# Patient Record
Sex: Female | Born: 1985 | Race: White | Hispanic: No | Marital: Single | Smoking: Never smoker
Health system: Southern US, Community
[De-identification: ages and names within clinical notes are randomized; demographics above are authoritative.]

## PROBLEM LIST (undated history)

## (undated) DIAGNOSIS — J45909 Unspecified asthma, uncomplicated: Secondary | ICD-10-CM

---
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
## 2006-09-06 ENCOUNTER — Encounter: Admission: RE | Admit: 2006-09-06 | Discharge: 2006-09-06 | Payer: Self-pay | Admitting: Orthopedic Surgery

## 2008-06-16 ENCOUNTER — Emergency Department (HOSPITAL_COMMUNITY): Admission: EM | Admit: 2008-06-16 | Discharge: 2008-06-16 | Payer: Self-pay | Admitting: Family Medicine

## 2008-09-04 ENCOUNTER — Ambulatory Visit: Payer: Self-pay | Admitting: Diagnostic Radiology

## 2008-09-04 ENCOUNTER — Emergency Department (HOSPITAL_BASED_OUTPATIENT_CLINIC_OR_DEPARTMENT_OTHER): Admission: EM | Admit: 2008-09-04 | Discharge: 2008-09-04 | Payer: Self-pay | Admitting: Emergency Medicine

## 2009-06-04 ENCOUNTER — Ambulatory Visit: Payer: Self-pay | Admitting: Family Medicine

## 2009-06-04 DIAGNOSIS — K089 Disorder of teeth and supporting structures, unspecified: Secondary | ICD-10-CM | POA: Insufficient documentation

## 2010-04-05 IMAGING — CR DG CHEST 2V
2 series · 2 of 2 positions shown · non-contrast
Comparison: 06/16/2008.

CLINICAL DATA: Cough and congestion.

CHEST - 2 VIEW

[w chest pa]
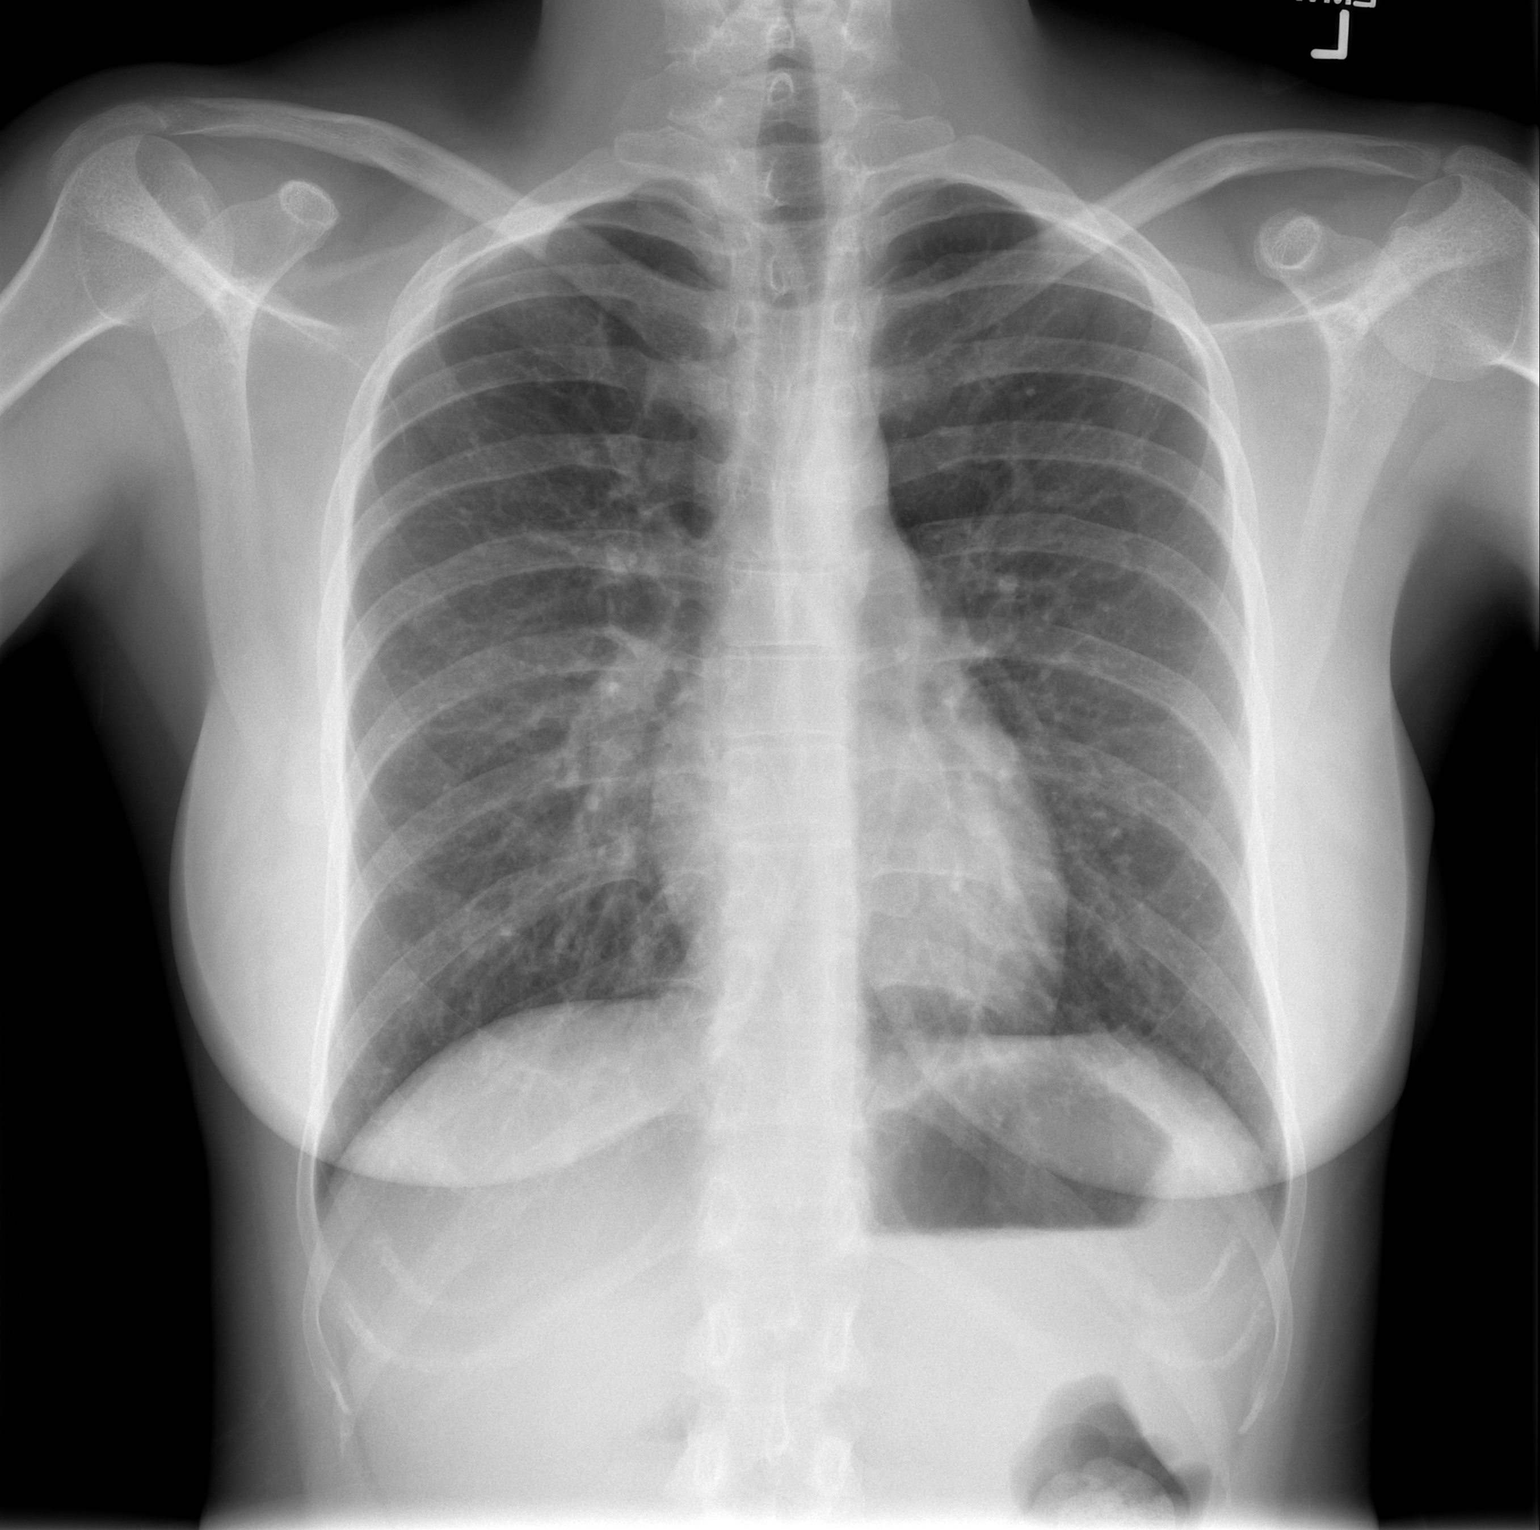

[w chest lat]
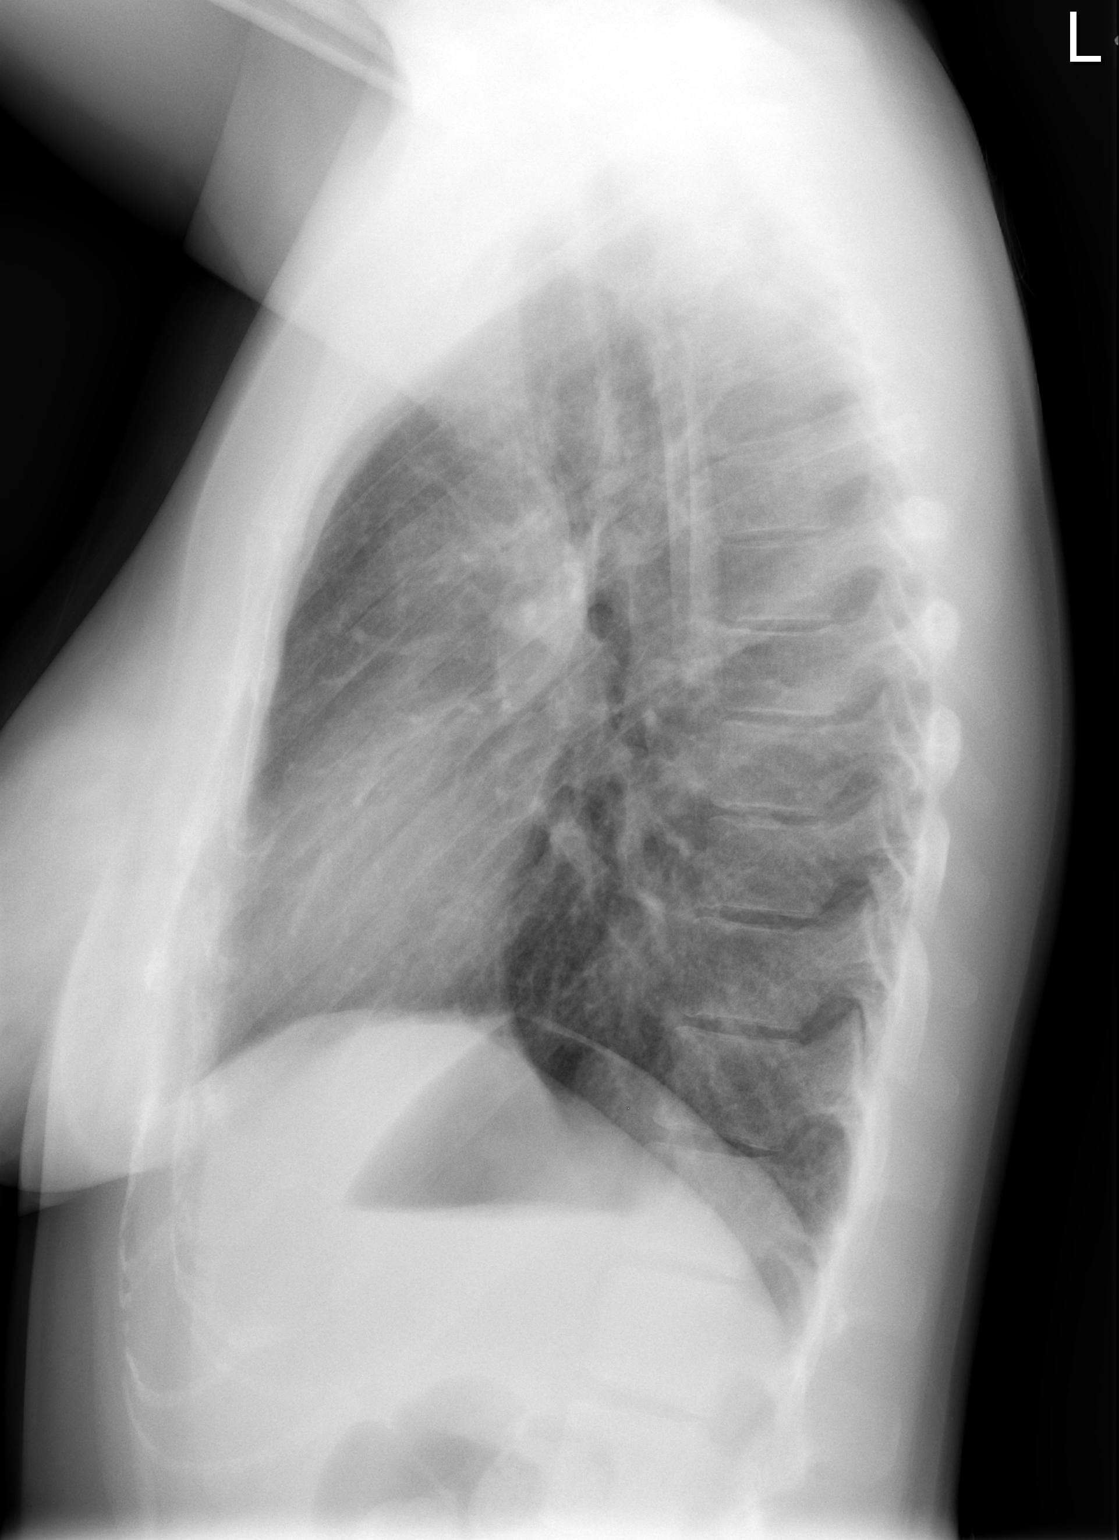

[2 of 2 positions shown; findings below may reference images not displayed]

FINDINGS: Prominent lung markings are present bilaterally and
unchanged from the prior study.  These appear  chronic.  No acute
infiltrate or effusion.  The lung volume is normal.
IMPRESSION: Chronic lung disease.  No acute pneumonia.

## 2010-09-01 ENCOUNTER — Other Ambulatory Visit (HOSPITAL_COMMUNITY)
Admission: RE | Admit: 2010-09-01 | Discharge: 2010-09-01 | Disposition: A | Discharge status: Home / Self Care | Payer: BC Managed Care – PPO | Source: Ambulatory Visit | Attending: Internal Medicine | Admitting: Internal Medicine

## 2010-09-01 ENCOUNTER — Ambulatory Visit (HOSPITAL_COMMUNITY): Payer: Self-pay

## 2010-09-01 DIAGNOSIS — Z01419 Encounter for gynecological examination (general) (routine) without abnormal findings: Secondary | ICD-10-CM | POA: Insufficient documentation

## 2010-09-01 DIAGNOSIS — Z113 Encounter for screening for infections with a predominantly sexual mode of transmission: Secondary | ICD-10-CM | POA: Insufficient documentation

## 2010-10-08 LAB — PREGNANCY, URINE: Preg Test, Ur: NEGATIVE

## 2011-09-07 ENCOUNTER — Other Ambulatory Visit (HOSPITAL_COMMUNITY)
Admission: RE | Admit: 2011-09-07 | Discharge: 2011-09-07 | Disposition: A | Discharge status: Home / Self Care | Payer: BC Managed Care – PPO | Source: Ambulatory Visit | Attending: Internal Medicine | Admitting: Internal Medicine

## 2011-09-07 DIAGNOSIS — Z01419 Encounter for gynecological examination (general) (routine) without abnormal findings: Secondary | ICD-10-CM | POA: Insufficient documentation

## 2012-10-05 ENCOUNTER — Other Ambulatory Visit (HOSPITAL_COMMUNITY)
Admission: RE | Admit: 2012-10-05 | Discharge: 2012-10-05 | Disposition: A | Discharge status: Home / Self Care | Payer: BC Managed Care – PPO | Source: Ambulatory Visit | Attending: Internal Medicine | Admitting: Internal Medicine

## 2012-10-05 DIAGNOSIS — Z01419 Encounter for gynecological examination (general) (routine) without abnormal findings: Secondary | ICD-10-CM | POA: Insufficient documentation

## 2014-06-27 ENCOUNTER — Other Ambulatory Visit (HOSPITAL_COMMUNITY)
Admission: RE | Admit: 2014-06-27 | Discharge: 2014-06-27 | Disposition: A | Discharge status: Home / Self Care | Payer: BC Managed Care – PPO | Source: Ambulatory Visit | Attending: Internal Medicine | Admitting: Internal Medicine

## 2014-06-27 DIAGNOSIS — Z01419 Encounter for gynecological examination (general) (routine) without abnormal findings: Secondary | ICD-10-CM | POA: Diagnosis not present

## 2015-04-03 ENCOUNTER — Ambulatory Visit: Payer: BC Managed Care – PPO

## 2015-04-08 ENCOUNTER — Ambulatory Visit: Payer: BC Managed Care – PPO

## 2015-04-11 ENCOUNTER — Ambulatory Visit: Payer: BC Managed Care – PPO | Attending: Internal Medicine

## 2015-06-17 ENCOUNTER — Emergency Department (INDEPENDENT_AMBULATORY_CARE_PROVIDER_SITE_OTHER)
Admission: EM | Admit: 2015-06-17 | Discharge: 2015-06-17 | Disposition: A | Discharge status: Home / Self Care | Payer: Self-pay | Source: Home / Self Care | Attending: Family Medicine | Admitting: Family Medicine

## 2015-06-17 ENCOUNTER — Emergency Department (INDEPENDENT_AMBULATORY_CARE_PROVIDER_SITE_OTHER): Payer: Self-pay

## 2015-06-17 ENCOUNTER — Encounter (HOSPITAL_COMMUNITY): Payer: Self-pay | Admitting: Emergency Medicine

## 2015-06-17 DIAGNOSIS — J42 Unspecified chronic bronchitis: Secondary | ICD-10-CM

## 2015-06-17 DIAGNOSIS — J4541 Moderate persistent asthma with (acute) exacerbation: Secondary | ICD-10-CM

## 2015-06-17 DIAGNOSIS — J209 Acute bronchitis, unspecified: Secondary | ICD-10-CM

## 2015-06-17 HISTORY — DX: Unspecified asthma, uncomplicated: J45.909

## 2015-06-17 MED ORDER — TRIAMCINOLONE ACETONIDE 40 MG/ML IJ SUSP
40.0000 mg | Freq: Once | INTRAMUSCULAR | Status: AC
  Administered 2015-06-17: 40 mg via INTRAMUSCULAR

## 2015-06-17 MED ORDER — PREDNISONE 20 MG PO TABS: ORAL_TABLET | ORAL | Status: DC

## 2015-06-17 MED ORDER — IPRATROPIUM-ALBUTEROL 0.5-2.5 (3) MG/3ML IN SOLN
3.0000 mL | Freq: Once | RESPIRATORY_TRACT | Status: AC
  Administered 2015-06-17: 3 mL via RESPIRATORY_TRACT

## 2015-06-17 MED ORDER — TRIAMCINOLONE ACETONIDE 40 MG/ML IJ SUSP
INTRAMUSCULAR | Status: AC
  Filled 2015-06-17: qty 1

## 2015-06-17 MED ORDER — IPRATROPIUM-ALBUTEROL 0.5-2.5 (3) MG/3ML IN SOLN
RESPIRATORY_TRACT | Status: AC
  Filled 2015-06-17: qty 3

## 2015-06-17 MED ORDER — ALBUTEROL SULFATE (2.5 MG/3ML) 0.083% IN NEBU
2.5000 mg | INHALATION_SOLUTION | Freq: Once | RESPIRATORY_TRACT | Status: AC
  Administered 2015-06-17: 2.5 mg via RESPIRATORY_TRACT

## 2015-06-17 MED ORDER — ALBUTEROL SULFATE (2.5 MG/3ML) 0.083% IN NEBU
INHALATION_SOLUTION | RESPIRATORY_TRACT | Status: AC
  Filled 2015-06-17: qty 3

## 2015-06-17 MED ORDER — BECLOMETHASONE DIPROPIONATE 80 MCG/ACT IN AERS: 2.0000 | INHALATION_SPRAY | Freq: Two times a day (BID) | RESPIRATORY_TRACT | Status: DC

## 2015-06-17 NOTE — Discharge Instructions (Signed)
Asthma, Acute Bronchospasm Add zantac 150 bid for a week to see if that helps with possible reflux. Acute bronchospasm caused by asthma is also referred to as an asthma attack. Bronchospasm means your air passages become narrowed. The narrowing is caused by inflammation and tightening of the muscles in the air tubes (bronchi) in your lungs. This can make it hard to breathe or cause you to wheeze and cough. CAUSES Possible triggers are:  Animal dander from the skin, hair, or feathers of animals.  Dust mites contained in house dust.  Cockroaches.  Pollen from trees or grass.  Mold.  Cigarette or tobacco smoke.  Air pollutants such as dust, household cleaners, hair sprays, aerosol sprays, paint fumes, strong chemicals, or strong odors.  Cold air or weather changes. Cold air may trigger inflammation. Winds increase molds and pollens in the air.  Strong emotions such as crying or laughing hard.  Stress.  Certain medicines such as aspirin or beta-blockers.  Sulfites in foods and drinks, such as dried fruits and wine.  Infections or inflammatory conditions, such as a flu, cold, or inflammation of the nasal membranes (rhinitis).  Gastroesophageal reflux disease (GERD). GERD is a condition where stomach acid backs up into your esophagus.  Exercise or strenuous activity. SIGNS AND SYMPTOMS   Wheezing.  Excessive coughing, particularly at night.  Chest tightness.  Shortness of breath. DIAGNOSIS  Your health care provider will ask you about your medical history and perform a physical exam. A chest X-ray or blood testing may be performed to look for other causes of your symptoms or other conditions that may have triggered your asthma attack. TREATMENT  Treatment is aimed at reducing inflammation and opening up the airways in your lungs. Most asthma attacks are treated with inhaled medicines. These include quick relief or rescue medicines (such as bronchodilators) and controller  medicines (such as inhaled corticosteroids). These medicines are sometimes given through an inhaler or a nebulizer. Systemic steroid medicine taken by mouth or given through an IV tube also can be used to reduce the inflammation when an attack is moderate or severe. Antibiotic medicines are only used if a bacterial infection is present.  HOME CARE INSTRUCTIONS   Rest.  Drink plenty of liquids. This helps the mucus to remain thin and be easily coughed up. Only use caffeine in moderation and do not use alcohol until you have recovered from your illness.  Do not smoke. Avoid being exposed to secondhand smoke.  You play a critical role in keeping yourself in good health. Avoid exposure to things that cause you to wheeze or to have breathing problems.  Keep your medicines up-to-date and available. Carefully follow your health care provider's treatment plan.  Take your medicine exactly as prescribed.  When pollen or pollution is bad, keep windows closed and use an air conditioner or go to places with air conditioning.  Asthma requires careful medical care. See your health care provider for a follow-up as advised. If you are more than [redacted] weeks pregnant and you were prescribed any new medicines, let your obstetrician know about the visit and how you are doing. Follow up with your health care provider as directed.  After you have recovered from your asthma attack, make an appointment with your outpatient doctor to talk about ways to reduce the likelihood of future attacks. If you do not have a doctor who manages your asthma, make an appointment with a primary care doctor to discuss your asthma. SEEK IMMEDIATE MEDICAL CARE IF:  You are getting worse.  You have trouble breathing. If severe, call your local emergency services (911 in the [place]  You develop chest pain or discomfort.  You are vomiting.  You are not able to keep fluids down.  You are coughing up yellow, green, brown, or bloody  sputum.  You have a fever and your symptoms suddenly get worse.  You have trouble swallowing. MAKE SURE YOU:   Understand these instructions.  Will watch your condition.  Will get help right away if you are not doing well or get worse.   This information is not intended to replace advice given to you by your health care provider. Make sure you discuss any questions you have with your health care provider.   Document Released: 09/29/2006 Document Revised: 06/19/2013 Document Reviewed: 12/20/2012 Elsevier Interactive Patient Education 2016 Else[employer] Inc.  Acute Bronchitis Bronchitis is when the airways that extend from the windpipe into the lungs get red, puffy, and painful (inflamed). Bronchitis often causes thick spit (mucus) to develop. This leads to a cough. A cough is the most common symptom of bronchitis. In acute bronchitis, the condition usually begins suddenly and goes away over time (usually in 2 weeks). Smoking, allergies, and asthma can make bronchitis worse. Repeated episodes of bronchitis may cause more lung problems. HOME CARE  Rest.  Drink enough fluids to keep your pee (urine) clear or pale yellow (unless you need to limit fluids as told by your doctor).  Only take over-the-counter or prescription medicines as told by your doctor.  Avoid smoking and secondhand smoke. These can make bronchitis worse. If you are a smoker, think about using nicotine gum or skin patches. Quitting smoking will help your lungs heal faster.  Reduce the chance of getting bronchitis again by:  Washing your hands often.  Avoiding people with cold symptoms.  Trying not to touch your hands to your mouth, nose, or eyes.  Follow up with your doctor as told. GET HELP IF: Your symptoms do not improve after 1 week of treatment. Symptoms include:  Cough.  Fever.  Coughing up thick spit.  Body aches.  Chest congestion.  Chills.  Shortness of breath.  Sore throat. GET HELP RIGHT  AWAY IF:   You have an increased fever.  You have chills.  You have severe shortness of breath.  You have bloody thick spit (sputum).  You throw up (vomit) often.  You lose too much body fluid (dehydration).  You have a severe headache.  You faint. MAKE SURE YOU:   Understand these instructions.  Will watch your condition.  Will get help right away if you are not doing well or get worse.   This information is not intended to replace advice given to you by your health care provider. Make sure you discuss any questions you have with your health care provider.   Document Released: 12/01/2007 Document Revised: 02/14/2013 Document Reviewed: 12/05/2012 Elsevier Interactive Patient Education 2016 Else[employer] Inc.  Chronic Bronchitis Chronic bronchitis is a lasting inflammation of the bronchial tubes, which are the tubes that carry air into your lungs. This is inflammation that occurs:   On most days of the week.   For at least three months at a time.   Over a period of two years in a row. When the bronchial tubes are inflamed, they start to produce mucus. The inflammation and buildup of mucus make it more difficult to breathe. Chronic bronchitis is usually a permanent problem and is one type of chronic obstructive  pulmonary disease (COPD). People with chronic bronchitis are at greater risk for getting repeated colds, or respiratory infections. CAUSES  Chronic bronchitis most often occurs in people who have:  Long-standing, severe asthma.  A history of smoking.  Asthma and who also smoke. SIGNS AND SYMPTOMS  Chronic bronchitis may cause the following:   A cough that brings up mucus (productive cough).  Shortness of breath.  Early morning headache.  Wheezing.  Chest discomfort.   Recurring respiratory infections. DIAGNOSIS  Your health care provider may confirm the diagnosis by:  Taking your medical history.  Performing a physical exam.  Taking a chest  X-ray.   Performing pulmonary function tests. TREATMENT  Treatment involves controlling symptoms with medicines, oxygen therapy, or making lifestyle changes, such as exercising and eating a healthy, well-balanced diet. Medicines could include:  Inhalers to improve air flow in and out of your lungs.  Antibiotics to treat bacterial infections, such as pneumonia, sinus infections, and acute bronchitis. As a preventative measure, your health care provider may recommend routine vaccinations for influenza and pneumonia. This is to prevent infection and hospitalization since you may be more at risk for these types of infections.  HOME CARE INSTRUCTIONS  Take medicines only as directed by your health care provider.   If you smoke cigarettes, chew tobacco, or use electronic cigarettes, quit. If you need help quitting, ask your health care provider.  Avoid pollen, dust, animal dander, molds, smoke, and other things that cause shortness of breath or wheezing attacks.  Talk to your health care provider about possible exercise routines. Regular exercise is very important to help you feel better.  If you are prescribed oxygen use at home follow these guidelines:  Never smoke while using oxygen. Oxygen does not burn or explode, but flammable materials will burn faster in the presence of oxygen.  Keep a Government social research officerfire extinguisher close by. Let your fire department know that you have oxygen in your home.  Warn visitors not to smoke near you when you are using oxygen. Put up "no smoking" signs in your home where you most often use the oxygen.  Regularly test your smoke detectors at home to make sure they work. If you receive care in your home from a nurse or other health care provider, he or she may also check to make sure your smoke detectors work.  Ask your health care provider whether you would benefit from a pulmonary rehabilitation program.  Do not wait to get medical care if you have any concerning  symptoms. Delays could cause permanent injury and may be life threatening. SEEK MEDICAL CARE IF:  You have increased coughing or shortness of breath or both.  You have muscle aches.  You have chest pain.  Your mucus gets thicker.  Your mucus changes from clear or white to yellow, green, gray, or bloody. SEEK IMMEDIATE MEDICAL CARE IF:  Your usual medicines do not stop your wheezing.   You have increased difficulty breathing.   You have any problems with the medicine you are taking, such as a rash, itching, swelling, or trouble breathing. MAKE SURE YOU:   Understand these instructions.  Will watch your condition.  Will get help right away if you are not doing well or get worse.   This information is not intended to replace advice given to you by your health care provider. Make sure you discuss any questions you have with your health care provider.   Document Released: 04/01/2006 Document Revised: 07/05/2014 Document Reviewed: 07/23/2013 Elsevier  Interactive Patient Education 2016 Elsevier Inc.  Cough, Adult A cough helps to clear your throat and lungs. A cough may last only 2-3 weeks (acute), or it may last longer than 8 weeks (chronic). Many different things can cause a cough. A cough may be a sign of an illness or another medical condition. HOME CARE  Pay attention to any changes in your cough.  Take medicines only as told by your doctor.  If you were prescribed an antibiotic medicine, take it as told by your doctor. Do not stop taking it even if you start to feel better.  Talk with your doctor before you try using a cough medicine.  Drink enough fluid to keep your pee (urine) clear or pale yellow.  If the air is dry, use a cold steam vaporizer or humidifier in your home.  Stay away from things that make you cough at work or at home.  If your cough is worse at night, try using extra pillows to raise your head up higher while you sleep.  Do not smoke, and try  not to be around smoke. If you need help quitting, ask your doctor.  Do not have caffeine.  Do not drink alcohol.  Rest as needed. GET HELP IF:  You have new problems (symptoms).  You cough up yellow fluid (pus).  Your cough does not get better after 2-3 weeks, or your cough gets worse.  Medicine does not help your cough and you are not sleeping well.  You have pain that gets worse or pain that is not helped with medicine.  You have a fever.  You are losing weight and you do not know why.  You have night sweats. GET HELP RIGHT AWAY IF:  You cough up blood.  You have trouble breathing.  Your heartbeat is very fast.   This information is not intended to replace advice given to you by your health care provider. Make sure you discuss any questions you have with your health care provider.   Document Released: 02/25/2011 Document Revised: 03/05/2015 Document Reviewed: 08/21/2014 Elsevier Interactive Patient Education [employer]

## 2015-06-17 NOTE — ED Notes (Signed)
Frequent coughing

## 2015-06-17 NOTE — ED Provider Notes (Signed)
CSN: [id number]     Arrival date & time 06/17/15  1519 History   First MD Initiated Contact with Patient 06/17/15 1620     Chief Complaint  Patient presents with  . Cough   (Consider location/radiation/quality/duration/timing/severity/associated sxs/prior Treatment) HPI Comments: 29 year old female with a history of asthma and allergies has had a cough for 6 weeks. She has spasms of cough and periods of prolonged cough. She uses albuterol but this has minimal effect. At some point during this time her mother found some antibiotic  gave her a seven-day course. She does not know the name. This did no good. She takes Allegra and Zyrtec separated by 12 hours. She notes some PND.  Patient is a 29 y.o. female presenting with cough.  Cough Associated symptoms: shortness of breath and wheezing   Associated symptoms: no ear pain, no fever and no sore throat     Past Medical History  Diagnosis Date  . Asthma    History reviewed. No pertinent past surgical history. No family history on file. Social History  Substance Use Topics  . Smoking status: Never Smoker   . Smokeless tobacco: None  . Alcohol Use: No   OB History    No data available     Review of Systems  Constitutional: Negative for fever, activity change and fatigue.  HENT: Positive for postnasal drip. Negative for ear pain and sore throat.   Eyes: Negative.   Respiratory: Positive for cough, shortness of breath and wheezing.   Cardiovascular: Negative.   Gastrointestinal: Negative.   Neurological: Negative.   All other systems reviewed and are negative.   Allergies  Review of patient's allergies indicates no known allergies.  Home Medications   Prior to Admission medications   Medication Sig Start Date End Date Taking? Authorizing Provider  Dextromethorphan Polistirex (DELSYM PO) Take by mouth.   Yes Historical Provider, MD  OVER THE COUNTER MEDICATION Inhaler antibiotic   Yes Historical Provider, MD   Pseudoephedrine-Guaifenesin (MUCINEX D PO) Take by mouth.   Yes Historical Provider, MD  beclomethasone (QVAR) 80 MCG/ACT inhaler Inhale 2 puffs into the lungs 2 (two) times daily. 06/17/15   Hayden Rasmussenavid Ajee Heasley, NP  predniSONE (DELTASONE) 20 MG tablet 3 Tabs PO Days 1-3, then 2 tabs PO Days 4-6, then 1 tab PO Day 7-9, then Half Tab PO Day 10-12 06/17/15   Hayden Rasmussenavid Canaan Holzer, NP   Meds Ordered and Administered this Visit   Medications  ipratropium-albuterol (DUONEB) 0.5-2.5 (3) MG/3ML nebulizer solution 3 mL (3 mLs Nebulization Given 06/17/15 1715)  triamcinolone acetonide (KENALOG-40) injection 40 mg (40 mg Intramuscular Given 06/17/15 1715)  albuterol (PROVENTIL) (2.5 MG/3ML) 0.083% nebulizer solution 2.5 mg (2.5 mg Nebulization Given 06/17/15 1759)    BP 111/81 mmHg  Pulse 86  Temp(Src) 98.5 F (36.9 C) (Oral)  Resp 20  SpO2 97%  LMP 06/07/2015 No data found.   Physical Exam  Constitutional: She appears well-developed and well-nourished. No distress.  HENT:  Right Ear: External ear normal.  Left Ear: External ear normal.  Oropharynx with minor erythema and clear PND. No exudates. Bilateral TMs are normal  Eyes: Conjunctivae and EOM are normal.  Neck: Normal range of motion. Neck supple.  Cardiovascular: Normal rate, regular rhythm and normal heart sounds.   Pulmonary/Chest: Effort normal. She has wheezes.  Prolonged expiratory phase. Diminished air sounds. Good chest expansion.  Musculoskeletal: She exhibits no edema.  Lymphadenopathy:    She has no cervical adenopathy.  Neurological: She is alert. She exhibits normal  muscle tone.  Skin: Skin is warm and dry. She is not diaphoretic.  Nursing note and vitals reviewed.   ED Course  Procedures (including critical care time)  Labs Review Labs Reviewed - No data to display  Imaging Review Dg Chest 2 View  06/17/2015  CLINICAL DATA:  Six week history of cough. History of chronic asthma and bronchitis. EXAM: CHEST  2 VIEW COMPARISON:   09/04/2008. FINDINGS: FNA cardiac silhouette, mediastinal and hilar contours are within normal limits. There are chronic bronchitic type lung changes and possible superimposed acute bronchitis but no infiltrates, edema or effusions. No worrisome pulmonary nodules. The bony thorax is intact. IMPRESSION: Bronchitic type changes in lungs which appear relatively chronic but could not exclude superimposed acute bronchitis. No infiltrates or effusions. Electronically Signed   By: Rudie Meyer M.D.   On: 06/17/2015 18:02     Visual Acuity Review  Right Eye Distance:   Left Eye Distance:   Bilateral Distance:    Right Eye Near:   Left Eye Near:    Bilateral Near:         MDM   1. Asthma exacerbation attacks, moderate persistent   2. Chronic bronchitis with acute exacerbation (HCC)    After duoneb  and albuterol 2.5 mg mod to good increase in air movement and decrease in wheeze. Cont with some coughing but less frequent. Prednisone taper dose qvar as dir If too expensive, rx for asmanex. Add zantac 150 bid for a week to see if that helps with possible reflux. Cont the albuterol HFA q 4h prn.     Hayden Rasmussen, NP 06/17/15 [phone]

## 2016-05-11 ENCOUNTER — Ambulatory Visit (INDEPENDENT_AMBULATORY_CARE_PROVIDER_SITE_OTHER): Payer: Self-pay | Admitting: Family Medicine

## 2016-05-11 VITALS — BP 102/74 | HR 95 | Temp 98.9°F | Ht 65.25 in | Wt 132.0 lb

## 2016-05-11 DIAGNOSIS — J45909 Unspecified asthma, uncomplicated: Secondary | ICD-10-CM | POA: Insufficient documentation

## 2016-05-11 DIAGNOSIS — J4520 Mild intermittent asthma, uncomplicated: Secondary | ICD-10-CM

## 2016-05-11 MED ORDER — AMOXICILLIN 500 MG PO TABS: 500.0000 mg | ORAL_TABLET | Freq: Two times a day (BID) | ORAL | 0 refills | Status: AC

## 2016-05-11 MED ORDER — BENZONATATE 100 MG PO CAPS: 200.0000 mg | ORAL_CAPSULE | Freq: Three times a day (TID) | ORAL | 0 refills | Status: AC | PRN

## 2016-05-11 MED ORDER — PREDNISONE 20 MG PO TABS: 20.0000 mg | ORAL_TABLET | Freq: Every day | ORAL | 0 refills | Status: DC

## 2016-05-11 MED ORDER — METHYLPREDNISOLONE ACETATE 80 MG/ML IJ SUSP
80.0000 mg | INTRAMUSCULAR | Status: AC
  Administered 2016-05-11: 80 mg via INTRAMUSCULAR

## 2016-05-11 NOTE — Progress Notes (Signed)
   Subjective:    Patient ID: Cindy AlpersSusan R Dunn, female    DOB: 02-Mar-1986, 30 y.o.   MRN: [id number]  HPI Asthma Patient is here for evaluation of asthma. Patient's symptoms include pt denies wheezing has not used Albuterol inhaler today/out of previous prescription Q-Var. Associated symptoms include nonproductive cough. The patient has been suffering from these symptoms for approximately 2 weeks worse at night.  Diagnosed with Asthma/hx of receiving allergy injections. Symptoms have been gradually worsening since their onset. Medications used in the past to treat these symptoms include inhaled steroids and over the counter medications. Suspected precipitants include no identifiable factor. Patient is awoken from sleep approximately > 5 times per night. Patient has not required emergency room treatment for these symptoms, and has not required hospitalization. The patient has not been intubated in the past. Denies SOB    Review of Systems  Constitutional: Negative.   HENT: Positive for voice change.   Eyes: Negative.   Respiratory: Positive for cough.   Cardiovascular: Negative.   Allergic/Immunologic: Positive for environmental allergies.  Neurological: Negative.   Psychiatric/Behavioral: Negative.        Objective:   Physical Exam  Constitutional: She is oriented to person, place, and time. She appears well-developed and well-nourished.  HENT:  Head: Normocephalic and atraumatic.  Right Ear: External ear normal.  Left Ear: External ear normal.  Nose: Nose normal.  Throat mild erythema  Eyes: Conjunctivae and EOM are normal. Pupils are equal, round, and reactive to light.  Cardiovascular: Normal rate, regular rhythm and normal heart sounds.   Pulmonary/Chest: Effort normal and breath sounds normal.  Expiratory wheeze noted LUL/non productive cough/no acute distress/  Neurological: She is alert and oriented to person, place, and time.  Skin: Skin is warm and dry.  Psychiatric: She  has a normal mood and affect. Her behavior is normal. Judgment and thought content normal.          Assessment & Plan:  See pt instructions/Hold antibiotic prescription x 3 days/Recommended Qvar for asthma control/pt without insurance.

## 2016-05-11 NOTE — Patient Instructions (Addendum)

## 2017-01-15 IMAGING — DX DG CHEST 2V
2 series · 2 of 2 positions shown · non-contrast
Comparison: 09/04/2008.

CLINICAL DATA: Six week history of cough. History of chronic asthma
and bronchitis.

EXAM:
CHEST  2 VIEW

[chest pa]
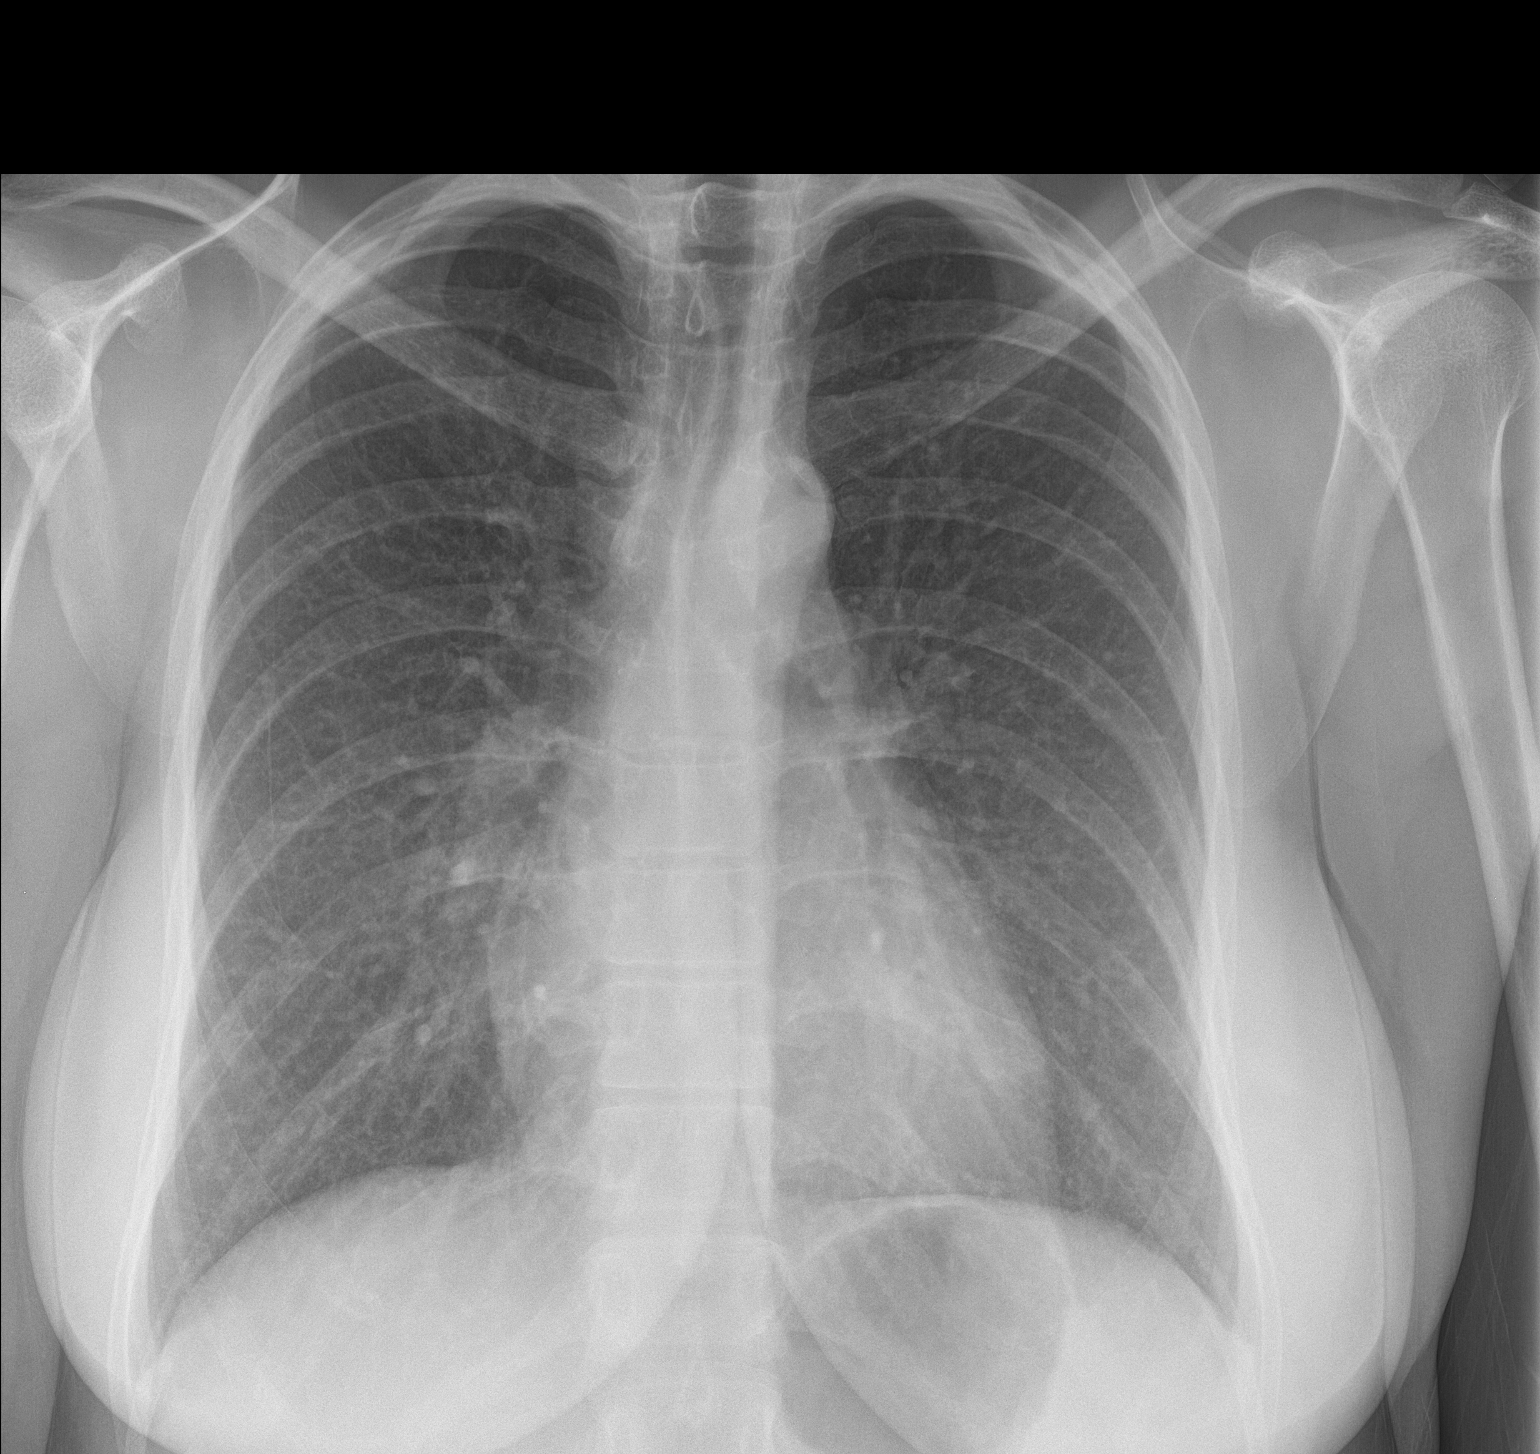

[chest lat]
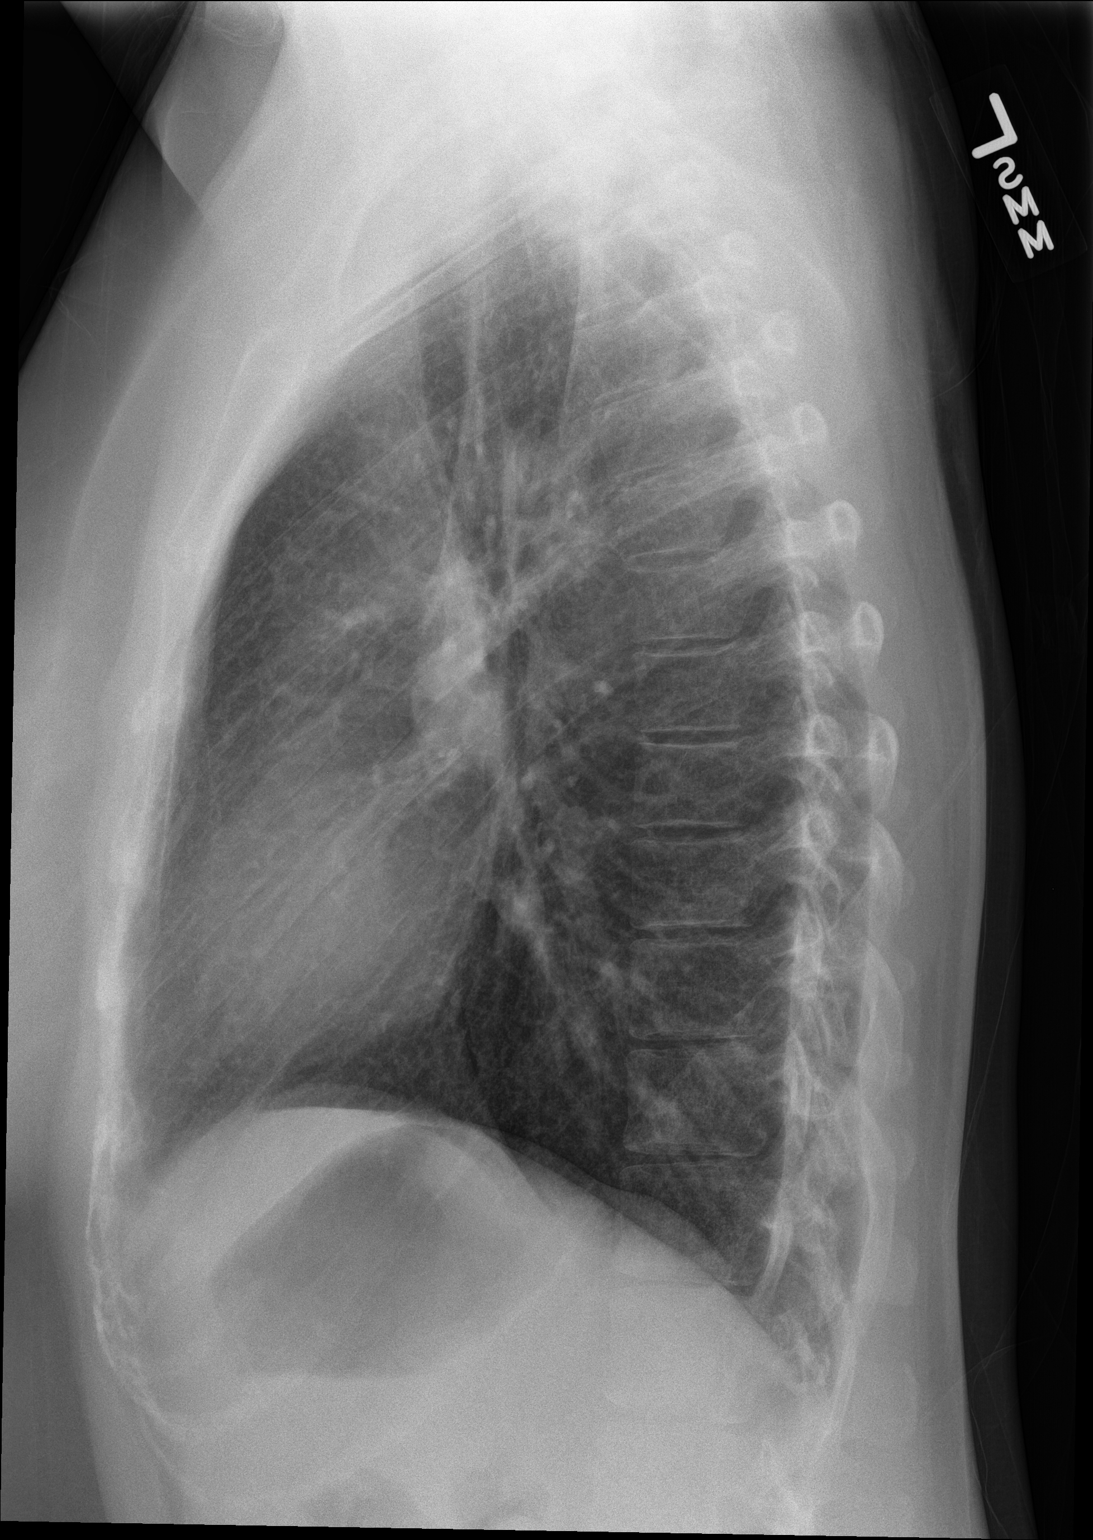

[2 of 2 positions shown; findings below may reference images not displayed]

FINDINGS: FNA cardiac silhouette, mediastinal and hilar contours are within
normal limits. There are chronic bronchitic type lung changes and
possible superimposed acute bronchitis but no infiltrates, edema or
effusions. No worrisome pulmonary nodules. The bony thorax is
intact.
IMPRESSION: Bronchitic type changes in lungs which appear relatively chronic but
could not exclude superimposed acute bronchitis. No infiltrates or
effusions.

## 2017-04-15 MED FILL — EPINEPHRINE 0.3 MG AUTO-INJ: 0.3 | 30 days supply | Qty: 2 | Fill #0

## 2017-10-03 DIAGNOSIS — I7300 Raynaud's syndrome without gangrene: Secondary | ICD-10-CM | POA: Insufficient documentation

## 2017-10-03 MED FILL — MONTELUKAST SOD 10 MG TAB: 10 | 30 days supply | Qty: 30 | Fill #0

## 2017-10-03 MED FILL — AZITHROMYCIN 250 MG TABLET: 250 | 5 days supply | Qty: 6 | Fill #0

## 2017-10-03 MED FILL — METHYLPREDNISOLONE 4 MG TAB: 4 | 6 days supply | Qty: 21 | Fill #0

## 2017-11-03 MED FILL — MONTELUKAST SOD 10 MG TAB: 10 | 30 days supply | Qty: 30 | Fill #1

## 2017-12-09 MED FILL — MONTELUKAST SOD 10 MG TAB: 10 | 30 days supply | Qty: 30 | Fill #2

## 2018-01-19 MED FILL — MONTELUKAST SOD 10 MG TAB: 10 | 30 days supply | Qty: 30 | Fill #0

## 2018-05-17 MED FILL — MONTELUKAST SOD 10 MG TAB: 10 | 30 days supply | Qty: 30 | Fill #1

## 2018-06-30 MED FILL — MONTELUKAST SOD 10 MG TAB: 10 | 30 days supply | Qty: 30 | Fill #2

## 2018-09-11 DIAGNOSIS — J3089 Other allergic rhinitis: Secondary | ICD-10-CM | POA: Insufficient documentation

## 2018-09-11 DIAGNOSIS — J302 Other seasonal allergic rhinitis: Secondary | ICD-10-CM | POA: Insufficient documentation

## 2018-09-22 MED FILL — MONTELUKAST SOD 10 MG TAB: 10 | 90 days supply | Qty: 90 | Fill #0

## 2019-01-05 MED FILL — MONTELUKAST SOD 10 MG TAB: 10 | 90 days supply | Qty: 90 | Fill #0

## 2019-04-25 MED FILL — MONTELUKAST SOD 10 MG TAB: 10 | 90 days supply | Qty: 90 | Fill #0

## 2020-03-24 ENCOUNTER — Other Ambulatory Visit (HOSPITAL_COMMUNITY): Payer: Self-pay | Admitting: Nurse Practitioner

## 2020-03-24 MED FILL — MONTELUKAST SOD 10 MG TAB: 10 | 90 days supply | Qty: 90 | Fill #0

## 2020-05-02 ENCOUNTER — Ambulatory Visit: Payer: Self-pay | Attending: Internal Medicine

## 2020-05-02 ENCOUNTER — Ambulatory Visit: Payer: Self-pay

## 2020-05-02 NOTE — Progress Notes (Signed)
   Covid-19 Vaccination Clinic  Name:  Cindy Dunn    MRN: [id number] DOB: [birth year]  05/02/2020  Cindy Dunn was observed post Covid-19 immunization for 15 minutes without incident. She was provided with Vaccine Information Sheet and instruction to access the V-Safe system.   Cindy Dunn was instructed to call 911 with any severe reactions post vaccine: Marland Kitchen Difficulty breathing  . Swelling of face and throat  . A fast heartbeat  . A bad rash all over body  . Dizziness and weakness

## 2020-05-05 ENCOUNTER — Ambulatory Visit: Payer: Self-pay

## 2020-06-10 ENCOUNTER — Other Ambulatory Visit (HOSPITAL_COMMUNITY): Payer: Self-pay | Admitting: Allergy and Immunology

## 2020-06-10 DIAGNOSIS — J3089 Other allergic rhinitis: Secondary | ICD-10-CM | POA: Diagnosis not present

## 2020-06-10 DIAGNOSIS — J301 Allergic rhinitis due to pollen: Secondary | ICD-10-CM | POA: Diagnosis not present

## 2020-06-10 DIAGNOSIS — J4530 Mild persistent asthma, uncomplicated: Secondary | ICD-10-CM | POA: Diagnosis not present

## 2020-06-11 ENCOUNTER — Other Ambulatory Visit (HOSPITAL_COMMUNITY): Payer: Self-pay | Admitting: Allergy and Immunology

## 2020-06-25 DIAGNOSIS — J301 Allergic rhinitis due to pollen: Secondary | ICD-10-CM | POA: Diagnosis not present

## 2020-06-25 DIAGNOSIS — Z20822 Contact with and (suspected) exposure to covid-19: Secondary | ICD-10-CM | POA: Diagnosis not present

## 2020-06-25 DIAGNOSIS — R059 Cough, unspecified: Secondary | ICD-10-CM | POA: Diagnosis not present

## 2020-06-25 DIAGNOSIS — J4521 Mild intermittent asthma with (acute) exacerbation: Secondary | ICD-10-CM | POA: Diagnosis not present

## 2020-06-25 MED FILL — ALBUTEROL SULFATE HFA 108 (: 108 (90 BAS | 17 days supply | Qty: 18 | Fill #0

## 2020-06-25 MED FILL — MONTELUKAST SOD 10 MG TAB: 10 | 90 days supply | Qty: 90 | Fill #0

## 2020-06-25 MED FILL — DESLORATADINE 5 MG TAB: 5 | 30 days supply | Qty: 30 | Fill #0

## 2020-06-25 MED FILL — EPINEPHRINE 0.3 MG AUTO-INJ: 0.3 | 2 days supply | Qty: 2 | Fill #0

## 2020-06-26 DIAGNOSIS — J3089 Other allergic rhinitis: Secondary | ICD-10-CM | POA: Diagnosis not present

## 2020-07-03 DIAGNOSIS — J3089 Other allergic rhinitis: Secondary | ICD-10-CM | POA: Diagnosis not present

## 2020-07-03 DIAGNOSIS — J301 Allergic rhinitis due to pollen: Secondary | ICD-10-CM | POA: Diagnosis not present

## 2020-07-07 DIAGNOSIS — J301 Allergic rhinitis due to pollen: Secondary | ICD-10-CM | POA: Diagnosis not present

## 2020-07-07 DIAGNOSIS — J3089 Other allergic rhinitis: Secondary | ICD-10-CM | POA: Diagnosis not present

## 2020-07-10 DIAGNOSIS — J301 Allergic rhinitis due to pollen: Secondary | ICD-10-CM | POA: Diagnosis not present

## 2020-07-10 DIAGNOSIS — J3089 Other allergic rhinitis: Secondary | ICD-10-CM | POA: Diagnosis not present

## 2020-07-15 DIAGNOSIS — J3089 Other allergic rhinitis: Secondary | ICD-10-CM | POA: Diagnosis not present

## 2020-07-15 DIAGNOSIS — J301 Allergic rhinitis due to pollen: Secondary | ICD-10-CM | POA: Diagnosis not present

## 2020-07-18 DIAGNOSIS — J3089 Other allergic rhinitis: Secondary | ICD-10-CM | POA: Diagnosis not present

## 2020-07-18 DIAGNOSIS — J301 Allergic rhinitis due to pollen: Secondary | ICD-10-CM | POA: Diagnosis not present

## 2020-07-22 DIAGNOSIS — J301 Allergic rhinitis due to pollen: Secondary | ICD-10-CM | POA: Diagnosis not present

## 2020-07-22 DIAGNOSIS — J3089 Other allergic rhinitis: Secondary | ICD-10-CM | POA: Diagnosis not present

## 2020-07-22 MED FILL — DESLORATADINE 5 MG TAB: 5 | 90 days supply | Qty: 90 | Fill #1

## 2020-07-24 DIAGNOSIS — J301 Allergic rhinitis due to pollen: Secondary | ICD-10-CM | POA: Diagnosis not present

## 2020-07-24 DIAGNOSIS — J3089 Other allergic rhinitis: Secondary | ICD-10-CM | POA: Diagnosis not present

## 2020-07-28 DIAGNOSIS — J301 Allergic rhinitis due to pollen: Secondary | ICD-10-CM | POA: Diagnosis not present

## 2020-07-28 DIAGNOSIS — J3089 Other allergic rhinitis: Secondary | ICD-10-CM | POA: Diagnosis not present

## 2020-08-01 DIAGNOSIS — J301 Allergic rhinitis due to pollen: Secondary | ICD-10-CM | POA: Diagnosis not present

## 2020-08-01 DIAGNOSIS — J3089 Other allergic rhinitis: Secondary | ICD-10-CM | POA: Diagnosis not present

## 2020-08-06 DIAGNOSIS — J3089 Other allergic rhinitis: Secondary | ICD-10-CM | POA: Diagnosis not present

## 2020-08-06 DIAGNOSIS — J301 Allergic rhinitis due to pollen: Secondary | ICD-10-CM | POA: Diagnosis not present

## 2020-08-12 DIAGNOSIS — J3089 Other allergic rhinitis: Secondary | ICD-10-CM | POA: Diagnosis not present

## 2020-08-12 DIAGNOSIS — J301 Allergic rhinitis due to pollen: Secondary | ICD-10-CM | POA: Diagnosis not present

## 2020-08-14 DIAGNOSIS — J301 Allergic rhinitis due to pollen: Secondary | ICD-10-CM | POA: Diagnosis not present

## 2020-08-14 DIAGNOSIS — J3089 Other allergic rhinitis: Secondary | ICD-10-CM | POA: Diagnosis not present

## 2020-08-22 DIAGNOSIS — J301 Allergic rhinitis due to pollen: Secondary | ICD-10-CM | POA: Diagnosis not present

## 2020-08-22 DIAGNOSIS — J3089 Other allergic rhinitis: Secondary | ICD-10-CM | POA: Diagnosis not present

## 2020-08-26 DIAGNOSIS — J301 Allergic rhinitis due to pollen: Secondary | ICD-10-CM | POA: Diagnosis not present

## 2020-08-26 DIAGNOSIS — J3089 Other allergic rhinitis: Secondary | ICD-10-CM | POA: Diagnosis not present

## 2020-08-29 DIAGNOSIS — J3089 Other allergic rhinitis: Secondary | ICD-10-CM | POA: Diagnosis not present

## 2020-08-29 DIAGNOSIS — J301 Allergic rhinitis due to pollen: Secondary | ICD-10-CM | POA: Diagnosis not present

## 2020-09-03 DIAGNOSIS — J3089 Other allergic rhinitis: Secondary | ICD-10-CM | POA: Diagnosis not present

## 2020-09-03 DIAGNOSIS — J301 Allergic rhinitis due to pollen: Secondary | ICD-10-CM | POA: Diagnosis not present

## 2020-09-05 DIAGNOSIS — J301 Allergic rhinitis due to pollen: Secondary | ICD-10-CM | POA: Diagnosis not present

## 2020-09-05 DIAGNOSIS — J3089 Other allergic rhinitis: Secondary | ICD-10-CM | POA: Diagnosis not present

## 2020-09-11 DIAGNOSIS — J3089 Other allergic rhinitis: Secondary | ICD-10-CM | POA: Diagnosis not present

## 2020-09-11 DIAGNOSIS — J301 Allergic rhinitis due to pollen: Secondary | ICD-10-CM | POA: Diagnosis not present

## 2020-09-16 DIAGNOSIS — J3089 Other allergic rhinitis: Secondary | ICD-10-CM | POA: Diagnosis not present

## 2020-09-16 DIAGNOSIS — J301 Allergic rhinitis due to pollen: Secondary | ICD-10-CM | POA: Diagnosis not present

## 2020-09-24 DIAGNOSIS — J3089 Other allergic rhinitis: Secondary | ICD-10-CM | POA: Diagnosis not present

## 2020-09-24 DIAGNOSIS — J301 Allergic rhinitis due to pollen: Secondary | ICD-10-CM | POA: Diagnosis not present

## 2020-09-29 ENCOUNTER — Other Ambulatory Visit (HOSPITAL_COMMUNITY): Payer: Self-pay

## 2020-09-29 DIAGNOSIS — J301 Allergic rhinitis due to pollen: Secondary | ICD-10-CM | POA: Diagnosis not present

## 2020-09-29 DIAGNOSIS — J3089 Other allergic rhinitis: Secondary | ICD-10-CM | POA: Diagnosis not present

## 2020-09-29 MED FILL — Montelukast Sodium Tab 10 MG (Base Equiv): ORAL | 90 days supply | Qty: 90 | Fill #0 | Status: AC

## 2020-10-06 DIAGNOSIS — J301 Allergic rhinitis due to pollen: Secondary | ICD-10-CM | POA: Diagnosis not present

## 2020-10-06 DIAGNOSIS — J3089 Other allergic rhinitis: Secondary | ICD-10-CM | POA: Diagnosis not present

## 2020-10-15 DIAGNOSIS — J301 Allergic rhinitis due to pollen: Secondary | ICD-10-CM | POA: Diagnosis not present

## 2020-10-15 DIAGNOSIS — J3089 Other allergic rhinitis: Secondary | ICD-10-CM | POA: Diagnosis not present

## 2020-10-21 DIAGNOSIS — J3089 Other allergic rhinitis: Secondary | ICD-10-CM | POA: Diagnosis not present

## 2020-10-21 DIAGNOSIS — J301 Allergic rhinitis due to pollen: Secondary | ICD-10-CM | POA: Diagnosis not present

## 2020-10-29 DIAGNOSIS — J3089 Other allergic rhinitis: Secondary | ICD-10-CM | POA: Diagnosis not present

## 2020-10-29 DIAGNOSIS — J301 Allergic rhinitis due to pollen: Secondary | ICD-10-CM | POA: Diagnosis not present

## 2020-11-06 DIAGNOSIS — J3089 Other allergic rhinitis: Secondary | ICD-10-CM | POA: Diagnosis not present

## 2020-11-06 DIAGNOSIS — J301 Allergic rhinitis due to pollen: Secondary | ICD-10-CM | POA: Diagnosis not present

## 2020-11-11 DIAGNOSIS — J301 Allergic rhinitis due to pollen: Secondary | ICD-10-CM | POA: Diagnosis not present

## 2020-11-11 DIAGNOSIS — J3089 Other allergic rhinitis: Secondary | ICD-10-CM | POA: Diagnosis not present

## 2020-11-12 ENCOUNTER — Other Ambulatory Visit (HOSPITAL_COMMUNITY): Payer: Self-pay

## 2020-11-12 MED FILL — Desloratadine Tab 5 MG: ORAL | 30 days supply | Qty: 30 | Fill #0 | Status: AC

## 2020-11-19 DIAGNOSIS — J301 Allergic rhinitis due to pollen: Secondary | ICD-10-CM | POA: Diagnosis not present

## 2020-11-19 DIAGNOSIS — J3089 Other allergic rhinitis: Secondary | ICD-10-CM | POA: Diagnosis not present

## 2020-11-28 DIAGNOSIS — J301 Allergic rhinitis due to pollen: Secondary | ICD-10-CM | POA: Diagnosis not present

## 2020-11-28 DIAGNOSIS — R21 Rash and other nonspecific skin eruption: Secondary | ICD-10-CM | POA: Diagnosis not present

## 2020-11-28 DIAGNOSIS — J3089 Other allergic rhinitis: Secondary | ICD-10-CM | POA: Diagnosis not present

## 2020-12-02 DIAGNOSIS — J301 Allergic rhinitis due to pollen: Secondary | ICD-10-CM | POA: Diagnosis not present

## 2020-12-02 DIAGNOSIS — J3089 Other allergic rhinitis: Secondary | ICD-10-CM | POA: Diagnosis not present

## 2020-12-10 DIAGNOSIS — J3089 Other allergic rhinitis: Secondary | ICD-10-CM | POA: Diagnosis not present

## 2020-12-10 DIAGNOSIS — J301 Allergic rhinitis due to pollen: Secondary | ICD-10-CM | POA: Diagnosis not present

## 2020-12-15 ENCOUNTER — Other Ambulatory Visit (HOSPITAL_COMMUNITY): Payer: Self-pay

## 2020-12-15 MED FILL — Desloratadine Tab 5 MG: ORAL | 30 days supply | Qty: 30 | Fill #1 | Status: AC

## 2020-12-17 DIAGNOSIS — J301 Allergic rhinitis due to pollen: Secondary | ICD-10-CM | POA: Diagnosis not present

## 2020-12-17 DIAGNOSIS — J3089 Other allergic rhinitis: Secondary | ICD-10-CM | POA: Diagnosis not present

## 2020-12-26 DIAGNOSIS — J301 Allergic rhinitis due to pollen: Secondary | ICD-10-CM | POA: Diagnosis not present

## 2020-12-26 DIAGNOSIS — J3089 Other allergic rhinitis: Secondary | ICD-10-CM | POA: Diagnosis not present

## 2020-12-30 DIAGNOSIS — J301 Allergic rhinitis due to pollen: Secondary | ICD-10-CM | POA: Diagnosis not present

## 2020-12-30 DIAGNOSIS — J3089 Other allergic rhinitis: Secondary | ICD-10-CM | POA: Diagnosis not present

## 2021-01-07 DIAGNOSIS — J3089 Other allergic rhinitis: Secondary | ICD-10-CM | POA: Diagnosis not present

## 2021-01-07 DIAGNOSIS — J301 Allergic rhinitis due to pollen: Secondary | ICD-10-CM | POA: Diagnosis not present

## 2021-01-14 ENCOUNTER — Other Ambulatory Visit (HOSPITAL_COMMUNITY): Payer: Self-pay

## 2021-01-14 DIAGNOSIS — J3089 Other allergic rhinitis: Secondary | ICD-10-CM | POA: Diagnosis not present

## 2021-01-14 DIAGNOSIS — J301 Allergic rhinitis due to pollen: Secondary | ICD-10-CM | POA: Diagnosis not present

## 2021-01-19 ENCOUNTER — Other Ambulatory Visit (HOSPITAL_COMMUNITY): Payer: Self-pay

## 2021-01-19 ENCOUNTER — Other Ambulatory Visit: Payer: Self-pay

## 2021-01-19 MED ORDER — DESLORATADINE 5 MG PO TABS
5.0000 mg | ORAL_TABLET | Freq: Every day | ORAL | 4 refills | Status: DC
  Filled 2021-01-19: qty 30, 30d supply, fill #0

## 2021-01-19 MED ORDER — MONTELUKAST SODIUM 10 MG PO TABS: 10.0000 mg | ORAL_TABLET | Freq: Every day | ORAL | 1 refills | Status: DC

## 2021-01-19 MED ORDER — MONTELUKAST SODIUM 10 MG PO TABS
10.0000 mg | ORAL_TABLET | Freq: Every day | ORAL | 1 refills | Status: DC
  Filled 2021-01-19: qty 90, 90d supply, fill #0
  Filled 2021-04-24: qty 90, 90d supply, fill #1

## 2021-01-21 DIAGNOSIS — J301 Allergic rhinitis due to pollen: Secondary | ICD-10-CM | POA: Diagnosis not present

## 2021-01-21 DIAGNOSIS — J3089 Other allergic rhinitis: Secondary | ICD-10-CM | POA: Diagnosis not present

## 2021-01-21 DIAGNOSIS — J3081 Allergic rhinitis due to animal (cat) (dog) hair and dander: Secondary | ICD-10-CM | POA: Diagnosis not present

## 2021-01-26 DIAGNOSIS — J3089 Other allergic rhinitis: Secondary | ICD-10-CM | POA: Diagnosis not present

## 2021-01-26 DIAGNOSIS — J301 Allergic rhinitis due to pollen: Secondary | ICD-10-CM | POA: Diagnosis not present

## 2021-02-02 DIAGNOSIS — J301 Allergic rhinitis due to pollen: Secondary | ICD-10-CM | POA: Diagnosis not present

## 2021-02-03 DIAGNOSIS — J3089 Other allergic rhinitis: Secondary | ICD-10-CM | POA: Diagnosis not present

## 2021-02-04 DIAGNOSIS — J3081 Allergic rhinitis due to animal (cat) (dog) hair and dander: Secondary | ICD-10-CM | POA: Diagnosis not present

## 2021-02-04 DIAGNOSIS — J3089 Other allergic rhinitis: Secondary | ICD-10-CM | POA: Diagnosis not present

## 2021-02-04 DIAGNOSIS — J301 Allergic rhinitis due to pollen: Secondary | ICD-10-CM | POA: Diagnosis not present

## 2021-02-09 DIAGNOSIS — J3089 Other allergic rhinitis: Secondary | ICD-10-CM | POA: Diagnosis not present

## 2021-02-09 DIAGNOSIS — J301 Allergic rhinitis due to pollen: Secondary | ICD-10-CM | POA: Diagnosis not present

## 2021-02-17 DIAGNOSIS — J301 Allergic rhinitis due to pollen: Secondary | ICD-10-CM | POA: Diagnosis not present

## 2021-02-17 DIAGNOSIS — J3089 Other allergic rhinitis: Secondary | ICD-10-CM | POA: Diagnosis not present

## 2021-02-23 ENCOUNTER — Other Ambulatory Visit (HOSPITAL_COMMUNITY): Payer: Self-pay

## 2021-02-23 DIAGNOSIS — J301 Allergic rhinitis due to pollen: Secondary | ICD-10-CM | POA: Diagnosis not present

## 2021-02-23 DIAGNOSIS — J3089 Other allergic rhinitis: Secondary | ICD-10-CM | POA: Diagnosis not present

## 2021-02-23 MED ORDER — DESLORATADINE 5 MG PO TABS
ORAL_TABLET | ORAL | 4 refills | Status: DC
  Filled 2021-02-23: qty 90, 90d supply, fill #0
  Filled 2021-05-22: qty 90, 90d supply, fill #1
  Filled 2021-07-28: qty 90, 90d supply, fill #2

## 2021-02-24 ENCOUNTER — Other Ambulatory Visit (HOSPITAL_COMMUNITY): Payer: Self-pay

## 2021-03-04 DIAGNOSIS — J301 Allergic rhinitis due to pollen: Secondary | ICD-10-CM | POA: Diagnosis not present

## 2021-03-04 DIAGNOSIS — J3089 Other allergic rhinitis: Secondary | ICD-10-CM | POA: Diagnosis not present

## 2021-03-11 DIAGNOSIS — J3089 Other allergic rhinitis: Secondary | ICD-10-CM | POA: Diagnosis not present

## 2021-03-11 DIAGNOSIS — J301 Allergic rhinitis due to pollen: Secondary | ICD-10-CM | POA: Diagnosis not present

## 2021-03-19 DIAGNOSIS — J301 Allergic rhinitis due to pollen: Secondary | ICD-10-CM | POA: Diagnosis not present

## 2021-03-19 DIAGNOSIS — J3089 Other allergic rhinitis: Secondary | ICD-10-CM | POA: Diagnosis not present

## 2021-03-24 DIAGNOSIS — J301 Allergic rhinitis due to pollen: Secondary | ICD-10-CM | POA: Diagnosis not present

## 2021-03-24 DIAGNOSIS — J3089 Other allergic rhinitis: Secondary | ICD-10-CM | POA: Diagnosis not present

## 2021-04-01 DIAGNOSIS — J301 Allergic rhinitis due to pollen: Secondary | ICD-10-CM | POA: Diagnosis not present

## 2021-04-01 DIAGNOSIS — J3089 Other allergic rhinitis: Secondary | ICD-10-CM | POA: Diagnosis not present

## 2021-04-08 DIAGNOSIS — J301 Allergic rhinitis due to pollen: Secondary | ICD-10-CM | POA: Diagnosis not present

## 2021-04-08 DIAGNOSIS — J3089 Other allergic rhinitis: Secondary | ICD-10-CM | POA: Diagnosis not present

## 2021-04-15 DIAGNOSIS — J3089 Other allergic rhinitis: Secondary | ICD-10-CM | POA: Diagnosis not present

## 2021-04-15 DIAGNOSIS — J301 Allergic rhinitis due to pollen: Secondary | ICD-10-CM | POA: Diagnosis not present

## 2021-04-22 DIAGNOSIS — J301 Allergic rhinitis due to pollen: Secondary | ICD-10-CM | POA: Diagnosis not present

## 2021-04-22 DIAGNOSIS — J3089 Other allergic rhinitis: Secondary | ICD-10-CM | POA: Diagnosis not present

## 2021-04-24 ENCOUNTER — Other Ambulatory Visit (HOSPITAL_COMMUNITY): Payer: Self-pay

## 2021-04-28 DIAGNOSIS — J301 Allergic rhinitis due to pollen: Secondary | ICD-10-CM | POA: Diagnosis not present

## 2021-04-28 DIAGNOSIS — J3089 Other allergic rhinitis: Secondary | ICD-10-CM | POA: Diagnosis not present

## 2021-05-06 DIAGNOSIS — J301 Allergic rhinitis due to pollen: Secondary | ICD-10-CM | POA: Diagnosis not present

## 2021-05-06 DIAGNOSIS — J3089 Other allergic rhinitis: Secondary | ICD-10-CM | POA: Diagnosis not present

## 2021-05-15 DIAGNOSIS — J301 Allergic rhinitis due to pollen: Secondary | ICD-10-CM | POA: Diagnosis not present

## 2021-05-15 DIAGNOSIS — J3089 Other allergic rhinitis: Secondary | ICD-10-CM | POA: Diagnosis not present

## 2021-05-20 DIAGNOSIS — J301 Allergic rhinitis due to pollen: Secondary | ICD-10-CM | POA: Diagnosis not present

## 2021-05-20 DIAGNOSIS — J3089 Other allergic rhinitis: Secondary | ICD-10-CM | POA: Diagnosis not present

## 2021-05-22 ENCOUNTER — Other Ambulatory Visit (HOSPITAL_COMMUNITY): Payer: Self-pay

## 2021-05-22 MED ORDER — MONTELUKAST SODIUM 10 MG PO TABS
10.0000 mg | ORAL_TABLET | Freq: Every day | ORAL | 1 refills | Status: DC
  Filled 2021-05-22: qty 90, 90d supply, fill #0

## 2021-05-29 DIAGNOSIS — J3089 Other allergic rhinitis: Secondary | ICD-10-CM | POA: Diagnosis not present

## 2021-05-29 DIAGNOSIS — J301 Allergic rhinitis due to pollen: Secondary | ICD-10-CM | POA: Diagnosis not present

## 2021-06-03 DIAGNOSIS — J301 Allergic rhinitis due to pollen: Secondary | ICD-10-CM | POA: Diagnosis not present

## 2021-06-03 DIAGNOSIS — J3089 Other allergic rhinitis: Secondary | ICD-10-CM | POA: Diagnosis not present

## 2021-06-11 DIAGNOSIS — J4530 Mild persistent asthma, uncomplicated: Secondary | ICD-10-CM | POA: Diagnosis not present

## 2021-06-11 DIAGNOSIS — J301 Allergic rhinitis due to pollen: Secondary | ICD-10-CM | POA: Diagnosis not present

## 2021-06-11 DIAGNOSIS — R051 Acute cough: Secondary | ICD-10-CM | POA: Diagnosis not present

## 2021-06-11 DIAGNOSIS — J3089 Other allergic rhinitis: Secondary | ICD-10-CM | POA: Diagnosis not present

## 2021-06-12 ENCOUNTER — Other Ambulatory Visit (HOSPITAL_COMMUNITY): Payer: Self-pay

## 2021-06-12 MED ORDER — AZITHROMYCIN 250 MG PO TABS
ORAL_TABLET | ORAL | 0 refills | Status: DC
  Filled 2021-06-12: qty 6, 5d supply, fill #0

## 2021-06-12 MED ORDER — AZELASTINE HCL 0.1 % NA SOLN
NASAL | 5 refills | Status: DC
  Filled 2021-06-12: qty 30, 25d supply, fill #0
  Filled 2021-09-09: qty 30, 25d supply, fill #1

## 2021-06-12 MED ORDER — PREDNISONE 10 MG PO TABS
ORAL_TABLET | ORAL | 0 refills | Status: DC
  Filled 2021-06-12: qty 10, 4d supply, fill #0

## 2021-06-17 DIAGNOSIS — J301 Allergic rhinitis due to pollen: Secondary | ICD-10-CM | POA: Diagnosis not present

## 2021-06-17 DIAGNOSIS — J3089 Other allergic rhinitis: Secondary | ICD-10-CM | POA: Diagnosis not present

## 2021-06-23 DIAGNOSIS — J3089 Other allergic rhinitis: Secondary | ICD-10-CM | POA: Diagnosis not present

## 2021-06-23 DIAGNOSIS — J301 Allergic rhinitis due to pollen: Secondary | ICD-10-CM | POA: Diagnosis not present

## 2021-06-24 ENCOUNTER — Other Ambulatory Visit (HOSPITAL_COMMUNITY): Payer: Self-pay

## 2021-06-30 ENCOUNTER — Other Ambulatory Visit (HOSPITAL_COMMUNITY): Payer: Self-pay

## 2021-06-30 ENCOUNTER — Telehealth: Payer: Self-pay | Admitting: Pulmonary Disease

## 2021-06-30 MED ORDER — PREDNISONE 10 MG PO TABS
ORAL_TABLET | ORAL | 0 refills | Status: DC
  Filled 2021-06-30: qty 10, 4d supply, fill #0

## 2021-06-30 MED ORDER — ALBUTEROL SULFATE HFA 108 (90 BASE) MCG/ACT IN AERS
INHALATION_SPRAY | RESPIRATORY_TRACT | 0 refills | Status: DC
  Filled 2021-06-30: qty 18, 25d supply, fill #0

## 2021-06-30 MED ORDER — ALBUTEROL SULFATE (2.5 MG/3ML) 0.083% IN NEBU
INHALATION_SOLUTION | RESPIRATORY_TRACT | 0 refills | Status: DC
  Filled 2021-06-30: qty 270, 30d supply, fill #0

## 2021-07-01 NOTE — Telephone Encounter (Signed)
I called and spoke with the pt and notified of response per Dr Judeth Horn. Nothing further needed. She will keep planned ov 07/03/21.

## 2021-07-01 NOTE — Telephone Encounter (Signed)
I called the patient and she reports that he cough is worse and she has been taking her prednisone that was recommended by her allergy provider. She reports that she wants to know if something stronger than promethazine that she has been taking at night some would help during the day fir the cough.   She has an OV on 07/03/21 with you. Please advise.

## 2021-07-01 NOTE — Telephone Encounter (Signed)
As I have not had a chance to formally evaluate her in clinic I can only recommend over the counter cough suppressant such as dextromethorphan.

## 2021-07-02 DIAGNOSIS — J3089 Other allergic rhinitis: Secondary | ICD-10-CM | POA: Diagnosis not present

## 2021-07-02 DIAGNOSIS — J301 Allergic rhinitis due to pollen: Secondary | ICD-10-CM | POA: Diagnosis not present

## 2021-07-03 ENCOUNTER — Ambulatory Visit (INDEPENDENT_AMBULATORY_CARE_PROVIDER_SITE_OTHER): Payer: 59

## 2021-07-03 ENCOUNTER — Encounter: Payer: Self-pay | Admitting: Pulmonary Disease

## 2021-07-03 ENCOUNTER — Other Ambulatory Visit: Payer: Self-pay

## 2021-07-03 ENCOUNTER — Other Ambulatory Visit (HOSPITAL_COMMUNITY): Payer: Self-pay

## 2021-07-03 ENCOUNTER — Ambulatory Visit (INDEPENDENT_AMBULATORY_CARE_PROVIDER_SITE_OTHER): Payer: 59 | Admitting: Pulmonary Disease

## 2021-07-03 VITALS — BP 118/62 | HR 74 | Temp 98.0°F | Ht 62.0 in | Wt 151.6 lb

## 2021-07-03 DIAGNOSIS — J4530 Mild persistent asthma, uncomplicated: Secondary | ICD-10-CM

## 2021-07-03 DIAGNOSIS — R053 Chronic cough: Secondary | ICD-10-CM

## 2021-07-03 MED ORDER — FLUTICASONE PROPIONATE 50 MCG/ACT NA SUSP
1.0000 | Freq: Two times a day (BID) | NASAL | 6 refills | Status: AC
  Filled 2021-07-03: qty 16, 30d supply, fill #0
  Filled 2021-09-09: qty 16, 30d supply, fill #1
  Filled 2021-11-20: qty 16, 30d supply, fill #2
  Filled 2022-06-11: qty 16, 30d supply, fill #3

## 2021-07-03 MED ORDER — FLUTICASONE-SALMETEROL 500-50 MCG/ACT IN AEPB
1.0000 | INHALATION_SPRAY | Freq: Two times a day (BID) | RESPIRATORY_TRACT | 6 refills | Status: DC
  Filled 2021-07-03: qty 60, 30d supply, fill #0
  Filled 2021-08-05: qty 60, 30d supply, fill #1
  Filled 2021-09-09: qty 60, 30d supply, fill #2

## 2021-07-03 NOTE — Patient Instructions (Addendum)
Nice to see you again  I think we should attack both the nasal congestion and your asthma very aggressively at the same time  For the asthma, continue the albuterol as needed.  Start Advair 1 puff in the morning and 1 puff in the evening/night.  Rinse your mouth out with water after every use.  For the nasal congestion, increase the Clarinex to twice a day.  If you need refills please let me know.  Continue Astelin spray twice a day.  Add Afrin 1 spray each nostril twice a day for 3 days then stop.  Take Flonase 1 spray each nostril twice a day as well.  We will get a chest x-ray today.  If symptoms or not improving by next month, come to the office the first week of February for a lab draw to evaluate if other medicines would help with asthma.  Return to clinic in 2 months or sooner as needed with Dr. Judeth Horn

## 2021-07-03 NOTE — Progress Notes (Signed)
@Patient  ID: , female    DOB: 1986/01/24, 36 y.o.   MRN: [id number]  Chief Complaint  Patient presents with   Consult    Consult for athsma. Pt states that she has had it all of her life. Pt states that her neb treatments dont really help her at all.     Referring provider: No ref. provider found  HPI:   36 y.o. with past med history of asthma who presents to clinic for evaluation and consultation of chronic cough and asthma.  Patient was diagnosed with asthma childhood.  Bronchodilators.  Review of records indicates in the past been on Symbicort, Breo.  Currently not on any maintenance inhalers.  Endorses longstanding history of nasal allergies.  Currently taking montelukast, antihistamine, Astelin nasal spray.  These prescribed at the direction of her allergy provider.  She is also receiving personalized allergy shots.  Despite this, she is at ongoing cough.  Worse over the last month or so.  Present throughout the day.  No times a day with better or worse.  No position to make things better or worse.  No environmental or seasonal factors that she can identify to make things better or worse.  Use albuterol with improvement although it is very short, less than.  No other alleviating or exacerbating factors.  Reviewed most recent chest imaging 05/2015 reveals mild hyperinflation, prominent bronchioles in the mid to lower lung fields my review and interpretation.  Chest x-ray 08/2008 reviewed and interpreted as clear lungs bilaterally, no evidence of hyperinflation on the film in my opinion.  PMH: Allergic rhinitis, asthma Surgical history:History reviewed. No pertinent surgical history. Family history: Denies significant respiratory illness in first-degree relatives. Social History: Never smoker, lives in [place], works as a [place] [employer], sport and exercise / Pulmonary Flowsheets:   ACT:  Asthma Control Test ACT Total Score  07/03/2021 8    MMRC: No  flowsheet data found.  Epworth:  No flowsheet data found.  Tests:   FENO:  No results found for: NITRICOXIDE  PFT: No flowsheet data found.  WALK:  No flowsheet data found.  Imaging: Personally reviewed and as per EMR discussion this note  Lab Results:  CBC Personally reviewed, distant labs notable for eosinophil count of 100 on only value in electronic medical record No results found for: WBC, RBC, HGB, HCT, PLT, MCV, MCH, MCHC, RDW, LYMPHSABS, MONOABS, EOSABS, BASOSABS  BMET No results found for: NA, K, CL, CO2, GLUCOSE, BUN, CREATININE, CALCIUM, GFRNONAA, GFRAA  BNP No results found for: BNP  ProBNP No results found for: PROBNP  Specialty Problems       Pulmonary Problems   Asthma    No Known Allergies  Immunization History  Administered Date(s) Administered   Influenza, High Dose Seasonal PF 04/27/2018, 06/10/2020   Influenza, Quadrivalent, Recombinant, Inj, Pf 04/20/2018, 04/08/2020   Influenza-Unspecified 04/08/2020, 03/23/2021   PFIZER(Purple Top)SARS-COV-2 Vaccination 04/11/2020, 05/02/2020   Tdap 11/09/2017    Past Medical History:  Diagnosis Date   Asthma     Tobacco History: Social History   Tobacco Use  Smoking Status Never  Smokeless Tobacco Not on file   Counseling given: Not Answered   Continue to not smoke  Outpatient Encounter Medications as of 07/03/2021  Medication Sig   albuterol (VENTOLIN HFA) 108 (90 Base) MCG/ACT inhaler Inhale 2 puffs into the lungs every 4-6 hours as needed   azelastine (ASTELIN) 0.1 % nasal spray Place 1 spray into each nostril twice a  day   desloratadine (CLARINEX) 5 MG tablet Take 1 tablet by mouth once a day   fluticasone (FLONASE) 50 MCG/ACT nasal spray Place 1 spray into both nostrils in the morning and at bedtime.   fluticasone-salmeterol (ADVAIR DISKUS) 500-50 MCG/ACT AEPB Inhale 1 puff into the lungs in the morning and at bedtime.   montelukast (SINGULAIR) 10 MG tablet TAKE 1 TABLET BY MOUTH  ONCE DAILY   budesonide-formoterol (SYMBICORT) 160-4.5 MCG/ACT inhaler    fluticasone (VERAMYST) 27.5 MCG/SPRAY nasal spray 2 sprays by Both Nostrils route as needed.   fluticasone furoate-vilanterol (BREO ELLIPTA) 200-25 MCG/ACT AEPB INL 1 PUFF PO QD   [DISCONTINUED] albuterol (PROVENTIL HFA;VENTOLIN HFA) 108 (90 Base) MCG/ACT inhaler Inhale 2 puffs into the lungs every 4 (four) hours as needed for wheezing or shortness of breath.   [DISCONTINUED] albuterol (PROVENTIL) (2.5 MG/3ML) 0.083% nebulizer solution Inhale 3 MLs by nebulizier every 4 to 6 hours as needed for cough/wheeze.   [DISCONTINUED] albuterol (VENTOLIN HFA) 108 (90 Base) MCG/ACT inhaler INHALE 2 PUFFS EVERY 4 TO 6 HOURS AS NEEDED   [DISCONTINUED] azithromycin (ZITHROMAX) 250 MG tablet Take 2 tablets by mouth on day 1, then 1 tablet by mouth once daily for days 2 - 5   [DISCONTINUED] montelukast (SINGULAIR) 10 MG tablet TAKE 1 TABLET BY MOUTH ONCE DAILY   [DISCONTINUED] predniSONE (DELTASONE) 10 MG tablet Take 4 tablets by mouth on day 1 then decrease by 1 tablet daily until finished (4-3-2-1)   No facility-administered encounter medications on file as of 07/03/2021.     Review of Systems  Review of Systems  No chest pain with exertion.  No orthopnea or PND.  No lower extremity swelling.  Comprehensive review of systems otherwise negative. Physical Exam  BP 118/62 (BP Location: Left Arm, Patient Position: Sitting, Cuff Size: Normal)    Pulse 74    Temp 98 F (36.7 C) (Oral)    Ht 5\' 2"  (1.575 m)    Wt 151 lb 9.6 oz (68.8 kg)    SpO2 99%    BMI 27.73 kg/m   Wt Readings from Last 5 Encounters:  07/03/21 151 lb 9.6 oz (68.8 kg)  05/11/16 132 lb (59.9 kg)    BMI Readings from Last 5 Encounters:  07/03/21 27.73 kg/m  05/11/16 21.80 kg/m     Physical Exam General: Well-appearing, no acute distress Eyes: EOMI, icterus Neck: Supple, JVP Pulmonary: Clear, normal work of breathing Cardiovascular regular in rhythm, no  murmur Abdomen: Nondistended, bowel sounds present MSK: No synovitis, no joint effusion Neuro: No weakness, normal gait Psych: Normal mood, full affect   Assessment & Plan:   Chronic cough: Acute on chronic worse over the last month.  Dry.  Likely multifactorial.  History of asthma with bronchitic changes and hyperinflation on prior chest x-ray so do think asthma is contributing.  Please see below.  In addition, likely contribution from postnasal drip given nasal congestion.  Please see below.  Lastly, GERD considered given dry nature of cough.  Chest x-ray today for further evaluation.  Asthma: Longstanding diagnosis.  Not on maintenance inhaler.  Likely contributor cough.  Start Advair high-dose Diskus 1 puff twice a day.  Continue albuterol as needed.  Consider phenotyping in the future, will order labs which can be collected in about 1 month given her recent prednisone use, last dose today.  Rhinosinusitis: Increase antihistamine to twice a day, continue Astelin twice a day, use Afrin twice a day for 3 days and stop, add Flonase 1  spray each nostril twice a day.  Assess response and see if cough improves.  Return in about 2 months (around 08/31/2021).   Karren Burly, MD 07/03/2021

## 2021-07-07 ENCOUNTER — Telehealth: Payer: Self-pay | Admitting: Pulmonary Disease

## 2021-07-07 NOTE — Telephone Encounter (Signed)
Called and spoke with Patient. Patient stated at [place] 07/03/21, she forgot to ask Dr. Silas Flood if she should continue allergy shots?  Patient stated she is due to have shots this week.   Message routed to Dr. Silas Flood to advise  LOV 07/03/21  Nice to see you again  I think we should attack both the nasal congestion and your asthma very aggressively at the same time   For the asthma, continue the albuterol as needed.  Start Advair 1 puff in the morning and 1 puff in the evening/night.  Rinse your mouth out with water after every use.  For the nasal congestion, increase the Clarinex to twice a day.  If you need refills please let me know.  Continue Astelin spray twice a day.  Add Afrin 1 spray each nostril twice a day for 3 days then stop.  Take Flonase 1 spray each nostril twice a day as well.   We will get a chest x-ray today.   If symptoms or not improving by next month, come to the office the first week of February for a lab draw to evaluate if other medicines would help with asthma.   Return to clinic in 2 months or sooner as needed with Dr. Silas Flood

## 2021-07-08 NOTE — Telephone Encounter (Signed)
OK to continue allergy shots

## 2021-07-08 NOTE — Telephone Encounter (Signed)
I would advise avoiding using the sterile gloves if at all possible.

## 2021-07-08 NOTE — Telephone Encounter (Signed)
Spoke with pt and reviewed Dr. Laurena Spies recommendations. Pt wanted to let Dr. Judeth Horn know that she has been using sterile gloves this week and they seem to aggregated her asthma and to ask if Dr. Judeth Horn has any advice on this.

## 2021-07-08 NOTE — Telephone Encounter (Signed)
Called and spoke with pt letting her know the info stated by Dr. Judeth Horn and she verbalized understanding.nothing further needed.

## 2021-07-08 NOTE — Telephone Encounter (Signed)
Called and spoke with patient to let her know the recs from Dr. Silas Flood. She states that she is in school for Nurse tech and they are using the sterile gloves for learning how to put in foley's take them out, sterile technique etc but she believes they are expired and thinks maybe that's why they messed with her asthma and hope that maybe ones in the hospital that aren't expired will be better.   Dr. Silas Flood please advise and if there are any other recommendations

## 2021-07-08 NOTE — Telephone Encounter (Signed)
Some of the gloves in the foley kits may have a powder that are bothering her. No further recommendations.

## 2021-07-10 DIAGNOSIS — J3089 Other allergic rhinitis: Secondary | ICD-10-CM | POA: Diagnosis not present

## 2021-07-10 DIAGNOSIS — J301 Allergic rhinitis due to pollen: Secondary | ICD-10-CM | POA: Diagnosis not present

## 2021-07-16 DIAGNOSIS — J301 Allergic rhinitis due to pollen: Secondary | ICD-10-CM | POA: Diagnosis not present

## 2021-07-17 DIAGNOSIS — J3089 Other allergic rhinitis: Secondary | ICD-10-CM | POA: Diagnosis not present

## 2021-07-17 DIAGNOSIS — J301 Allergic rhinitis due to pollen: Secondary | ICD-10-CM | POA: Diagnosis not present

## 2021-07-23 ENCOUNTER — Other Ambulatory Visit: Payer: Self-pay | Admitting: Pulmonary Disease

## 2021-07-23 DIAGNOSIS — J4540 Moderate persistent asthma, uncomplicated: Secondary | ICD-10-CM

## 2021-07-24 DIAGNOSIS — J301 Allergic rhinitis due to pollen: Secondary | ICD-10-CM | POA: Diagnosis not present

## 2021-07-24 DIAGNOSIS — J3089 Other allergic rhinitis: Secondary | ICD-10-CM | POA: Diagnosis not present

## 2021-07-26 ENCOUNTER — Encounter: Payer: Self-pay | Admitting: Pulmonary Disease

## 2021-07-26 DIAGNOSIS — R0602 Shortness of breath: Secondary | ICD-10-CM

## 2021-07-26 DIAGNOSIS — J4540 Moderate persistent asthma, uncomplicated: Secondary | ICD-10-CM

## 2021-07-27 NOTE — Telephone Encounter (Signed)
Recommend referral to allergy. Avoid all dairy products.

## 2021-07-27 NOTE — Telephone Encounter (Signed)
Dr. Judeth Horn please advise on the following My Chart message:   Cindy Dunn  [facility] (supporting Hunsucker, Lesia Sago, MD) 12 hours ago (9:33 PM)   My family has been noticing that every time I have some type of milk product.Marland Kitchen ice cream, coffee creamer or even yogurt that I start to have problems breathing and have to stop eating it and it last for a few minutes til I use my inhaler.  Can you test for this or do you think it may be something else?  Thank you

## 2021-07-29 ENCOUNTER — Other Ambulatory Visit (HOSPITAL_COMMUNITY): Payer: Self-pay

## 2021-07-29 ENCOUNTER — Other Ambulatory Visit (HOSPITAL_COMMUNITY)
Admission: RE | Admit: 2021-07-29 | Discharge: 2021-07-29 | Disposition: A | Discharge status: Home / Self Care | Payer: 59 | Source: Ambulatory Visit | Attending: Pulmonary Disease | Admitting: Pulmonary Disease

## 2021-07-29 DIAGNOSIS — J4540 Moderate persistent asthma, uncomplicated: Secondary | ICD-10-CM | POA: Insufficient documentation

## 2021-07-29 LAB — CBC WITH DIFFERENTIAL/PLATELET
Abs Immature Granulocytes: 0.02 10*3/uL (ref 0.00–0.07)
Basophils Absolute: 0.1 10*3/uL (ref 0.0–0.1)
Basophils Relative: 1 %
Eosinophils Absolute: 0.3 10*3/uL (ref 0.0–0.5)
Eosinophils Relative: 4 %
HCT: 38.5 % (ref 36.0–46.0)
Hemoglobin: 12.6 g/dL (ref 12.0–15.0)
Immature Granulocytes: 0 %
Lymphocytes Relative: 20 %
Lymphs Abs: 1.6 10*3/uL (ref 0.7–4.0)
MCH: 29.9 pg (ref 26.0–34.0)
MCHC: 32.7 g/dL (ref 30.0–36.0)
MCV: 91.4 fL (ref 80.0–100.0)
Monocytes Absolute: 0.7 10*3/uL (ref 0.1–1.0)
Monocytes Relative: 9 %
Neutro Abs: 5.4 10*3/uL (ref 1.7–7.7)
Neutrophils Relative %: 66 %
Platelets: 306 10*3/uL (ref 150–400)
RBC: 4.21 MIL/uL (ref 3.87–5.11)
RDW: 13.3 % (ref 11.5–15.5)
WBC: 8.1 10*3/uL (ref 4.0–10.5)
nRBC: 0 % (ref 0.0–0.2)

## 2021-07-30 ENCOUNTER — Telehealth: Payer: Self-pay | Admitting: Pulmonary Disease

## 2021-07-30 ENCOUNTER — Other Ambulatory Visit (HOSPITAL_COMMUNITY): Payer: Self-pay

## 2021-07-30 MED ORDER — DESLORATADINE 5 MG PO TABS
5.0000 mg | ORAL_TABLET | Freq: Two times a day (BID) | ORAL | 3 refills | Status: DC
Start: 2021-07-30 — End: 2021-11-30
  Filled 2021-07-30: qty 90, 45d supply, fill #0
  Filled 2021-09-09: qty 90, 45d supply, fill #1
  Filled 2021-11-20: qty 90, 45d supply, fill #2

## 2021-07-30 NOTE — Telephone Encounter (Signed)
Called and spoke with patient about refills. Verified pharmacy and medication. Nothing further

## 2021-07-31 ENCOUNTER — Other Ambulatory Visit (HOSPITAL_COMMUNITY): Payer: Self-pay

## 2021-07-31 ENCOUNTER — Telehealth: Payer: Self-pay | Admitting: Pulmonary Disease

## 2021-07-31 DIAGNOSIS — J3089 Other allergic rhinitis: Secondary | ICD-10-CM | POA: Diagnosis not present

## 2021-07-31 DIAGNOSIS — J3081 Allergic rhinitis due to animal (cat) (dog) hair and dander: Secondary | ICD-10-CM | POA: Diagnosis not present

## 2021-07-31 DIAGNOSIS — J301 Allergic rhinitis due to pollen: Secondary | ICD-10-CM | POA: Diagnosis not present

## 2021-07-31 NOTE — Telephone Encounter (Signed)
Spoke to patient and relayed below message. She voiced her understanding.  Nothing further needed.   

## 2021-07-31 NOTE — Telephone Encounter (Signed)
Spoke to patient, who is requesting lab results.   Dr. Judeth Horn, please advise. thanks

## 2021-07-31 NOTE — Telephone Encounter (Signed)
All labs are not resulted yet. Awaiting IgE and allergen panel.

## 2021-08-02 LAB — MISC LABCORP TEST (SEND OUT): Labcorp test code: 62497

## 2021-08-02 LAB — IGE: IgE (Immunoglobulin E), Serum: 14 IU/mL (ref 6–495)

## 2021-08-03 NOTE — Telephone Encounter (Signed)
Mychart message sent by pt requesting to know the results of recent bloodwork. Dr. Judeth Horn, please advise.

## 2021-08-05 ENCOUNTER — Other Ambulatory Visit (HOSPITAL_COMMUNITY): Payer: Self-pay

## 2021-08-05 NOTE — Telephone Encounter (Addendum)
Dr. Judeth Horn,  Pt is questioning if she needs to continue to avoid dairy. Thanks.

## 2021-08-06 NOTE — Telephone Encounter (Signed)
Dr. Judeth Horn, please see most recent mychart message sent by pt and advise.

## 2021-08-07 DIAGNOSIS — J301 Allergic rhinitis due to pollen: Secondary | ICD-10-CM | POA: Diagnosis not present

## 2021-08-07 DIAGNOSIS — J3089 Other allergic rhinitis: Secondary | ICD-10-CM | POA: Diagnosis not present

## 2021-08-14 DIAGNOSIS — J3089 Other allergic rhinitis: Secondary | ICD-10-CM | POA: Diagnosis not present

## 2021-08-14 DIAGNOSIS — J301 Allergic rhinitis due to pollen: Secondary | ICD-10-CM | POA: Diagnosis not present

## 2021-08-19 DIAGNOSIS — J301 Allergic rhinitis due to pollen: Secondary | ICD-10-CM | POA: Diagnosis not present

## 2021-08-19 DIAGNOSIS — J3089 Other allergic rhinitis: Secondary | ICD-10-CM | POA: Diagnosis not present

## 2021-08-26 DIAGNOSIS — J301 Allergic rhinitis due to pollen: Secondary | ICD-10-CM | POA: Diagnosis not present

## 2021-08-26 DIAGNOSIS — J3089 Other allergic rhinitis: Secondary | ICD-10-CM | POA: Diagnosis not present

## 2021-09-02 DIAGNOSIS — J301 Allergic rhinitis due to pollen: Secondary | ICD-10-CM | POA: Diagnosis not present

## 2021-09-02 DIAGNOSIS — J3089 Other allergic rhinitis: Secondary | ICD-10-CM | POA: Diagnosis not present

## 2021-09-04 ENCOUNTER — Other Ambulatory Visit: Payer: Self-pay

## 2021-09-04 ENCOUNTER — Encounter: Payer: Self-pay | Admitting: Pulmonary Disease

## 2021-09-04 ENCOUNTER — Ambulatory Visit (INDEPENDENT_AMBULATORY_CARE_PROVIDER_SITE_OTHER): Payer: 59 | Admitting: Pulmonary Disease

## 2021-09-04 VITALS — BP 122/74 | HR 74 | Temp 97.9°F | Ht 62.0 in | Wt 151.4 lb

## 2021-09-04 DIAGNOSIS — J4520 Mild intermittent asthma, uncomplicated: Secondary | ICD-10-CM

## 2021-09-04 DIAGNOSIS — R053 Chronic cough: Secondary | ICD-10-CM | POA: Diagnosis not present

## 2021-09-04 NOTE — Progress Notes (Signed)
? ?@Patient  ID: Cindy Dunn, female    DOB: March 23, 1986, 36 y.o.   MRN: [id number] ? ?Chief Complaint  ?Patient presents with  ? Follow-up  ?  Follow up. Pt states that her asthma is doing really well with no issues noted.   ? ? ?Referring provider: ?Medicine, [facility]* ? ?HPI:  ? ?36 y.o. with past med history of asthma who presents to clinic for evaluation  of chronic cough and asthma. ? ?Was placed on high-dose Advair.  Cough resolved.  Has had intermittent dyspnea on exertion.  Albuterol is helpful.  However she not been using albuterol much in the last few weeks.  She reports good adherence to her nasal sprays for nasal allergies and nasal congestion.  In addition, taking her Clarinex.  Overall symptoms much improved.  Discussed at length the role and rationale for continuing therapy.  At least 6 months of well-controlled symptoms, ideally 1 year.  She expressed understanding.  Discussed bringing nebulizer machine on recent cruise, may be worth her peace of mind although she can contact [facility] to see if they have nebulizer set up in the medical day.  We reviewed and discussed at length her recent lab results that showed normal IgE, normal CBC with eosinophils of 300 within normal limits for possible target for biologic therapy in the future if symptoms become uncontrolled. ? ?HPI at initial visit: ?Patient was diagnosed with asthma childhood.  Bronchodilators.  Review of records indicates in the past been on Symbicort, Breo.  Currently not on any maintenance inhalers.  Endorses longstanding history of nasal allergies.  Currently taking montelukast, antihistamine, Astelin nasal spray.  These prescribed at the direction of her allergy provider.  She is also receiving personalized allergy shots.  Despite this, she is at ongoing cough.  Worse over the last month or so.  Present throughout the day.  No times a day with better or worse.  No position to make things better or worse.  No environmental or  seasonal factors that she can identify to make things better or worse.  Use albuterol with improvement although it is very short, less than.  No other alleviating or exacerbating factors. ? ?Reviewed most recent chest imaging 05/2015 reveals mild hyperinflation, prominent bronchioles in the mid to lower lung fields my review and interpretation.  Chest x-ray 08/2008 reviewed and interpreted as clear lungs bilaterally, no evidence of hyperinflation on the film in my opinion. ? ?PMH: Allergic rhinitis, asthma ?Surgical history:History reviewed. No pertinent surgical history. ?Family history: Denies significant respiratory illness in first-degree relatives. ?Social History: Never smoker, lives in [place], works as a Chartered certified accountant at [employer] ? ?Questionaires / Pulmonary Flowsheets:  ? ?ACT:  ?Asthma Control Test ACT Total Score  ?07/03/2021 8  ? ? ?MMRC: ?No flowsheet data found. ? ?Epworth:  ?No flowsheet data found. ? ?Tests:  ? ?FENO:  ?No results found for: NITRICOXIDE ? ?PFT: ?No flowsheet data found. ? ?WALK:  ?No flowsheet data found. ? ?Imaging: ?Personally reviewed and as per EMR discussion this note ? ?Lab Results: ? ?CBC ?Personally reviewed, distant labs notable for eosinophil count of 100 on only value in electronic medical record ?   ?Component Value Date/Time  ? WBC 8.1 07/29/2021 1047  ? RBC 4.21 07/29/2021 1047  ? HGB 12.6 07/29/2021 1047  ? HCT 38.5 07/29/2021 1047  ? PLT 306 07/29/2021 1047  ? MCV 91.4 07/29/2021 1047  ? MCH 29.9 07/29/2021 1047  ? MCHC 32.7 07/29/2021 1047  ?  RDW 13.3 07/29/2021 1047  ? LYMPHSABS 1.6 07/29/2021 1047  ? MONOABS 0.7 07/29/2021 1047  ? EOSABS 0.3 07/29/2021 1047  ? BASOSABS 0.1 07/29/2021 1047  ? ? ?BMET ?No results found for: NA, K, CL, CO2, GLUCOSE, BUN, CREATININE, CALCIUM, GFRNONAA, GFRAA ? ?BNP ?No results found for: BNP ? ?ProBNP ?No results found for: PROBNP ? ?Specialty Problems   ? ?  ? Pulmonary Problems  ? Asthma  ? ? ?No Known Allergies ? ?Immunization  History  ?Administered Date(s) Administered  ? Influenza, High Dose Seasonal PF 04/27/2018, 06/10/2020  ? Influenza, Quadrivalent, Recombinant, Inj, Pf 04/20/2018, 04/08/2020  ? Influenza-Unspecified 04/08/2020, 03/23/2021  ? PFIZER(Purple Top)SARS-COV-2 Vaccination 04/11/2020, 05/02/2020  ? Tdap 11/09/2017  ? ? ?Past Medical History:  ?Diagnosis Date  ? Asthma   ? ? ?Tobacco History: ?Social History  ? ?Tobacco Use  ?Smoking Status Never  ?Smokeless Tobacco Not on file  ? ?Counseling given: Not Answered ? ? ?Continue to not smoke ? ?Outpatient Encounter Medications as of 09/04/2021  ?Medication Sig  ? albuterol (VENTOLIN HFA) 108 (90 Base) MCG/ACT inhaler Inhale 2 puffs into the lungs every 4-6 hours as needed  ? azelastine (ASTELIN) 0.1 % nasal spray Place 1 spray into each nostril twice a day  ? desloratadine (CLARINEX) 5 MG tablet Take 1 tablet (5 mg total) by mouth 2 (two) times daily.  ? fluticasone (FLONASE) 50 MCG/ACT nasal spray Place 1 spray into both nostrils in the morning and at bedtime.  ? fluticasone-salmeterol (ADVAIR DISKUS) 500-50 MCG/ACT AEPB Inhale 1 puff into the lungs in the morning and at bedtime.  ? [DISCONTINUED] desloratadine (CLARINEX) 5 MG tablet Take 1 tablet by mouth once a day  ? ?No facility-administered encounter medications on file as of 09/04/2021.  ? ? ? ?Review of Systems ? ?Review of Systems  ?N/a ?Physical Exam ? ?BP 122/74 (BP Location: Left Arm, Patient Position: Sitting, Cuff Size: Normal)   Pulse 74   Temp 97.9 ?F (36.6 ?C) (Oral)   Ht 5\' 2"  (1.575 m)   Wt 151 lb 6.4 oz (68.7 kg)   SpO2 100%   BMI 27.69 kg/m?  ? ?Wt Readings from Last 5 Encounters:  ?09/04/21 151 lb 6.4 oz (68.7 kg)  ?07/03/21 151 lb 9.6 oz (68.8 kg)  ?05/11/16 132 lb (59.9 kg)  ? ? ?BMI Readings from Last 5 Encounters:  ?09/04/21 27.69 kg/m?  ?07/03/21 27.73 kg/m?  ?05/11/16 21.80 kg/m?  ? ? ? ?Physical Exam ?General: Well-appearing, no acute distress ?Eyes: EOMI, icterus ?Neck: Supple, JVP ?Pulmonary:  Clear, normal work of breathing ?Cardiovascular regular in rhythm, no murmur ?MSK: No synovitis, no joint effusion ?Neuro: No weakness, normal gait ?Psych: Normal mood, full affect ? ? ?Assessment & Plan:  ? ?Chronic cough: Acute on chronic worse over the last month.  Dry.  Likely multifactorial.  Since he has resolved with ICS/LABA therapy.  Likely related to asthma. ? ?Asthma: Longstanding diagnosis.  Not on maintenance inhaler.  Likely contributor cough.  Advair high-dose Diskus 1 puff twice a day with large improvement in cough as well as intermittent dyspnea, chest tightness.  Continue albuterol as needed.  Eosinophils 300 07/2021, consider Biologics in the future if symptoms become uncontrolled. ? ?Rhinosinusitis: Continue antihistamine twice a day, continue Astelin twice a day, continue Flonase 1 spray each nostril twice a day.  Encouraged to continue through pollen season and can cut back in the next several weeks and assess response. ? ?Return in about 3 months (around 12/05/2021). ? ? ?  Lanier Clam, MD ?09/04/2021 ? ? ? ?

## 2021-09-04 NOTE — Patient Instructions (Addendum)
Nice to see you again ? ?We will continue Advair at the highest dose, continue nasal sprays through pollen season then can cut back.  ? ?At next visit, we can discuss decreasing the strength of the advair. ? ?Return to clinic in 3 months or sooner as needed. ? ?

## 2021-09-09 ENCOUNTER — Other Ambulatory Visit (HOSPITAL_COMMUNITY): Payer: Self-pay

## 2021-09-09 MED ORDER — ALBUTEROL SULFATE HFA 108 (90 BASE) MCG/ACT IN AERS
INHALATION_SPRAY | RESPIRATORY_TRACT | 0 refills | Status: DC
  Filled 2021-09-09: qty 18, 16d supply, fill #0

## 2021-09-10 ENCOUNTER — Other Ambulatory Visit (HOSPITAL_COMMUNITY): Payer: Self-pay

## 2021-09-11 DIAGNOSIS — J301 Allergic rhinitis due to pollen: Secondary | ICD-10-CM | POA: Diagnosis not present

## 2021-09-11 DIAGNOSIS — J3089 Other allergic rhinitis: Secondary | ICD-10-CM | POA: Diagnosis not present

## 2021-09-24 DIAGNOSIS — J301 Allergic rhinitis due to pollen: Secondary | ICD-10-CM | POA: Diagnosis not present

## 2021-09-24 DIAGNOSIS — J3089 Other allergic rhinitis: Secondary | ICD-10-CM | POA: Diagnosis not present

## 2021-09-30 DIAGNOSIS — J3081 Allergic rhinitis due to animal (cat) (dog) hair and dander: Secondary | ICD-10-CM | POA: Diagnosis not present

## 2021-09-30 DIAGNOSIS — J301 Allergic rhinitis due to pollen: Secondary | ICD-10-CM | POA: Diagnosis not present

## 2021-09-30 DIAGNOSIS — J3089 Other allergic rhinitis: Secondary | ICD-10-CM | POA: Diagnosis not present

## 2021-10-14 DIAGNOSIS — J3089 Other allergic rhinitis: Secondary | ICD-10-CM | POA: Diagnosis not present

## 2021-10-14 DIAGNOSIS — J301 Allergic rhinitis due to pollen: Secondary | ICD-10-CM | POA: Diagnosis not present

## 2021-10-19 DIAGNOSIS — J301 Allergic rhinitis due to pollen: Secondary | ICD-10-CM | POA: Diagnosis not present

## 2021-10-19 DIAGNOSIS — J4530 Mild persistent asthma, uncomplicated: Secondary | ICD-10-CM | POA: Diagnosis not present

## 2021-10-19 DIAGNOSIS — J3089 Other allergic rhinitis: Secondary | ICD-10-CM | POA: Diagnosis not present

## 2021-10-26 ENCOUNTER — Encounter: Payer: Self-pay | Admitting: Pulmonary Disease

## 2021-10-26 DIAGNOSIS — J4520 Mild intermittent asthma, uncomplicated: Secondary | ICD-10-CM

## 2021-10-26 DIAGNOSIS — R053 Chronic cough: Secondary | ICD-10-CM

## 2021-10-26 DIAGNOSIS — R0602 Shortness of breath: Secondary | ICD-10-CM

## 2021-10-29 DIAGNOSIS — J301 Allergic rhinitis due to pollen: Secondary | ICD-10-CM | POA: Diagnosis not present

## 2021-10-29 DIAGNOSIS — J3081 Allergic rhinitis due to animal (cat) (dog) hair and dander: Secondary | ICD-10-CM | POA: Diagnosis not present

## 2021-10-29 DIAGNOSIS — J3089 Other allergic rhinitis: Secondary | ICD-10-CM | POA: Diagnosis not present

## 2021-11-04 DIAGNOSIS — J3089 Other allergic rhinitis: Secondary | ICD-10-CM | POA: Diagnosis not present

## 2021-11-04 DIAGNOSIS — J301 Allergic rhinitis due to pollen: Secondary | ICD-10-CM | POA: Diagnosis not present

## 2021-11-13 DIAGNOSIS — J3089 Other allergic rhinitis: Secondary | ICD-10-CM | POA: Diagnosis not present

## 2021-11-13 DIAGNOSIS — J301 Allergic rhinitis due to pollen: Secondary | ICD-10-CM | POA: Diagnosis not present

## 2021-11-13 DIAGNOSIS — J3081 Allergic rhinitis due to animal (cat) (dog) hair and dander: Secondary | ICD-10-CM | POA: Diagnosis not present

## 2021-11-20 ENCOUNTER — Other Ambulatory Visit (HOSPITAL_COMMUNITY): Payer: Self-pay

## 2021-11-24 ENCOUNTER — Other Ambulatory Visit (HOSPITAL_COMMUNITY): Payer: Self-pay

## 2021-11-25 ENCOUNTER — Other Ambulatory Visit (HOSPITAL_COMMUNITY): Payer: Self-pay

## 2021-11-25 ENCOUNTER — Telehealth: Payer: Self-pay | Admitting: Pulmonary Disease

## 2021-11-25 NOTE — Telephone Encounter (Signed)
Hey MH,  Patient is stating she needs refills for her Clarinex 5mg  but her insurance is only wanting to cover it for 1 time a day. And she states that you told her to take it twice a day.   Im not sure what to do since this is an insurance issue.   Do you still want her to take it twice a day?  Please advise sir

## 2021-11-26 DIAGNOSIS — J3089 Other allergic rhinitis: Secondary | ICD-10-CM | POA: Diagnosis not present

## 2021-11-26 DIAGNOSIS — J301 Allergic rhinitis due to pollen: Secondary | ICD-10-CM | POA: Diagnosis not present

## 2021-11-28 ENCOUNTER — Other Ambulatory Visit (HOSPITAL_COMMUNITY): Payer: Self-pay

## 2021-11-28 ENCOUNTER — Telehealth: Payer: Self-pay | Admitting: Pharmacy Technician

## 2021-11-28 NOTE — Telephone Encounter (Signed)
Patient Advocate Encounter  Received notification from [employer] that prior authorization for DESLORATIDINE 5MG  is required.   PA submitted on 6.3.23 Key [id number] Status is pending   [facility] will continue to follow  8.3.23, CPhT Patient Advocate Phone: [phone]

## 2021-11-30 ENCOUNTER — Other Ambulatory Visit (HOSPITAL_COMMUNITY): Payer: Self-pay

## 2021-11-30 MED ORDER — DESLORATADINE 5 MG PO TABS
5.0000 mg | ORAL_TABLET | Freq: Every day | ORAL | 3 refills | Status: DC
  Filled 2021-11-30: qty 90, 90d supply, fill #0
  Filled 2022-06-17: qty 90, 90d supply, fill #1
  Filled 2022-08-18: qty 90, 90d supply, fill #2
  Filled 2022-08-19: qty 30, 30d supply, fill #2
  Filled 2022-09-15: qty 30, 30d supply, fill #3

## 2021-11-30 NOTE — Telephone Encounter (Signed)
Can try to back down to once daily and see how symptoms go.

## 2021-11-30 NOTE — Telephone Encounter (Signed)
Attempted to call patient to inform her that MH wants to back the medication down to once a day to see how  symptoms are. Nothing further noted.

## 2021-12-01 DIAGNOSIS — J301 Allergic rhinitis due to pollen: Secondary | ICD-10-CM | POA: Diagnosis not present

## 2021-12-02 DIAGNOSIS — J3089 Other allergic rhinitis: Secondary | ICD-10-CM | POA: Diagnosis not present

## 2021-12-04 ENCOUNTER — Other Ambulatory Visit (HOSPITAL_COMMUNITY): Payer: Self-pay

## 2021-12-04 NOTE — Telephone Encounter (Signed)
Received a fax regarding Prior Authorization from [facility] for  DESLORATADINE . Authorization has been DENIED because plan only covers #1 tablet per day. Denial letter sent to scan center.  PA request was submitted for #90 tablets as a 45-day supply based off of previous medication fill; however according to pt's fill history, neither of the previous 45-day supply fills were covered under insurance. Her copay has historically always been $5 per 30 days, while she paid $42.72 for both 45-day fills.  The Rx was successfully billed to insurance as a 90-day supply with a copay of $15. Nothing further needed at this time.

## 2021-12-10 ENCOUNTER — Ambulatory Visit (INDEPENDENT_AMBULATORY_CARE_PROVIDER_SITE_OTHER): Payer: 59 | Admitting: Nurse Practitioner

## 2021-12-10 ENCOUNTER — Encounter (HOSPITAL_BASED_OUTPATIENT_CLINIC_OR_DEPARTMENT_OTHER): Payer: Self-pay | Admitting: Nurse Practitioner

## 2021-12-10 VITALS — BP 101/75 | HR 67 | Ht 62.5 in | Wt 146.2 lb

## 2021-12-10 DIAGNOSIS — Z0000 Encounter for general adult medical examination without abnormal findings: Secondary | ICD-10-CM | POA: Diagnosis not present

## 2021-12-10 DIAGNOSIS — J4530 Mild persistent asthma, uncomplicated: Secondary | ICD-10-CM | POA: Diagnosis not present

## 2021-12-10 NOTE — Progress Notes (Signed)
Orma Render, DNP, AGNP-c Primary Care & Sports Medicine [address]  [place], [place] [address] [phone]  New patient visit   Patient: Cindy Dunn   DOB: 01/31/1986   36 y.o. Female  MRN: [id number] Visit Date: 12/10/2021  Patient Care Team: Orma Render, NP as PCP - General (Nurse Practitioner)  Today's Vitals   12/10/21 0826  BP: 101/75  Pulse: 67  SpO2: 100%  Weight: 146 lb 3.2 oz (66.3 kg)  Height: 5' 2.5" (1.588 m)   Body mass index is 26.31 kg/m.   Today's healthcare provider: Orma Render, NP  Here with her mother Judeen Hammans  Chief Complaint  Patient presents with   New Patient (Initial Visit)    Pt here for new patient appt to establish care    Subjective    ROMINA DIVIRGILIO is a 36 y.o. female who presents today as a new patient to establish care.    Patient endorses the following concerns presently: None today.  She would like a CPE.  Dietary choices: dairy foods trigger her asthma and she has been avoiding dairy foods and this has improved her asthma symptoms. 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  She endorses walking a significant amount during her 12-hour shifts. No mood concerns- no depression or anxiety Normal regular periods. No cramping or heavy bleeding.  No concerns for STI today. No significant medical history aside from asthma.   History reviewed and reveals the following: Past Medical History:  Diagnosis Date   Asthma    History reviewed. No pertinent surgical history. No family status information on file.   History reviewed. No pertinent family history. Social History   Socioeconomic History   Marital status: Single    Spouse name: Not on file   Number of children: Not on file   Years of education: Not on file   Highest education level: Not on file  Occupational History   Not on file  Tobacco Use   Smoking status: Never   Smokeless tobacco: Not on file  Substance and  Sexual Activity   Alcohol use: No   Drug use: No   Sexual activity: Not on file  Other Topics Concern   Not on file  Social History Narrative   Not on file   Social Determinants of Health   Financial Resource Strain: Not on file  Food Insecurity: Not on file  Transportation Needs: Not on file  Physical Activity: Not on file  Stress: Not on file  Social Connections: Not on file   Outpatient Medications Prior to Visit  Medication Sig   albuterol (VENTOLIN HFA) 108 (90 Base) MCG/ACT inhaler Inhale 2 puffs into the lungs every 4 to 6 hours as needed   azelastine (ASTELIN) 0.1 % nasal spray Place 1 spray into each nostril twice a day   desloratadine (CLARINEX) 5 MG tablet Take 1 tablet (5 mg total) by mouth daily.   EPINEPHrine 0.3 mg/0.3 mL IJ SOAJ injection Inject into the muscle.   fluticasone (FLONASE) 50 MCG/ACT nasal spray Place 1 spray into both nostrils in the morning and at bedtime.   fluticasone-salmeterol (ADVAIR DISKUS) 500-50 MCG/ACT AEPB Inhale 1 puff into the lungs in the morning and at bedtime.   Olopatadine HCl 0.6 % SOLN 2 sprays in each nostril   [DISCONTINUED] montelukast (SINGULAIR) 10 MG tablet    No facility-administered medications prior to visit.   Allergies  Allergen Reactions   Milk Protein  Shortness Of Breath    ASTHMA FLARES   Immunization History  Administered Date(s) Administered   Influenza, High Dose Seasonal PF 04/27/2018, 06/10/2020   Influenza, Quadrivalent, Recombinant, Inj, Pf 04/20/2018, 04/08/2020   Influenza-Unspecified 04/08/2020, 03/23/2021   PFIZER(Purple Top)SARS-COV-2 Vaccination 04/11/2020, 05/02/2020   Tdap 11/09/2017    Review of Systems All review of systems negative except what is listed in the HPI   Objective    BP 101/75   Pulse 67   Ht 5' 2.5" (1.588 m)   Wt 146 lb 3.2 oz (66.3 kg)   SpO2 100%   BMI 26.31 kg/m  Physical Exam Vitals and nursing note reviewed.  Constitutional:      General: She is not in acute  distress.    Appearance: Normal appearance.  HENT:     Head: Normocephalic and atraumatic.     Right Ear: Hearing, tympanic membrane, ear canal and external ear normal.     Left Ear: Hearing, tympanic membrane, ear canal and external ear normal.     Nose: Nose normal.     Right Sinus: No maxillary sinus tenderness or frontal sinus tenderness.     Left Sinus: No maxillary sinus tenderness or frontal sinus tenderness.     Mouth/Throat:     Lips: Pink.     Mouth: Mucous membranes are moist.     Pharynx: Oropharynx is clear.  Eyes:     General: Lids are normal. Vision grossly intact.     Extraocular Movements: Extraocular movements intact.     Conjunctiva/sclera: Conjunctivae normal.     Pupils: Pupils are equal, round, and reactive to light.     Funduscopic exam:    Right eye: Red reflex present.        Left eye: Red reflex present.    Visual Fields: Right eye visual fields normal and left eye visual fields normal.  Neck:     Thyroid: No thyromegaly.     Vascular: No carotid bruit.  Cardiovascular:     Rate and Rhythm: Normal rate and regular rhythm.     Chest Wall: PMI is not displaced.     Pulses: Normal pulses.          Dorsalis pedis pulses are 2+ on the right side and 2+ on the left side.       Posterior tibial pulses are 2+ on the right side and 2+ on the left side.     Heart sounds: Normal heart sounds. No murmur heard. Pulmonary:     Effort: Pulmonary effort is normal. No respiratory distress.     Breath sounds: Normal breath sounds.  Abdominal:     General: Abdomen is flat. Bowel sounds are normal. There is no distension.     Palpations: Abdomen is soft. There is no hepatomegaly, splenomegaly or mass.     Tenderness: There is no abdominal tenderness. There is no right CVA tenderness, left CVA tenderness, guarding or rebound.  Musculoskeletal:        General: Normal range of motion.     Cervical back: Full passive range of motion without pain, normal range of motion and  neck supple. No tenderness.     Right lower leg: No edema.     Left lower leg: No edema.  Feet:     Left foot:     Toenail Condition: Left toenails are normal.  Lymphadenopathy:     Cervical: No cervical adenopathy.     Upper Body:     Right upper body: No  supraclavicular adenopathy.     Left upper body: No supraclavicular adenopathy.  Skin:    General: Skin is warm and dry.     Capillary Refill: Capillary refill takes less than 2 seconds.     Nails: There is no clubbing.  Neurological:     General: No focal deficit present.     Mental Status: She is alert and oriented to person, place, and time.     GCS: GCS eye subscore is 4. GCS verbal subscore is 5. GCS motor subscore is 6.     Sensory: Sensation is intact.     Motor: Motor function is intact.     Coordination: Coordination is intact.     Gait: Gait is intact.     Deep Tendon Reflexes: Reflexes are normal and symmetric.  Psychiatric:        Attention and Perception: Attention normal.        Mood and Affect: Mood normal.        Speech: Speech normal.        Behavior: Behavior normal. Behavior is cooperative.        Thought Content: Thought content normal.        Cognition and Memory: Cognition and memory normal.        Judgment: Judgment normal.     Results for orders placed or performed in visit on 12/10/21  CBC With Diff/Platelet  Result Value Ref Range   WBC 8.0 3.4 - 10.8 x10E3/uL   RBC 4.49 3.77 - 5.28 x10E6/uL   Hemoglobin 13.3 11.1 - 15.9 g/dL   Hematocrit 40.8 34.0 - 46.6 %   MCV 91 79 - 97 fL   MCH 29.6 26.6 - 33.0 pg   MCHC 32.6 31.5 - 35.7 g/dL   RDW 12.7 11.7 - 15.4 %   Platelets 278 150 - 450 x10E3/uL   Neutrophils 65 Not Estab. %   Lymphs 18 Not Estab. %   Monocytes 10 Not Estab. %   Eos 6 Not Estab. %   Basos 1 Not Estab. %   Neutrophils Absolute 5.2 1.4 - 7.0 x10E3/uL   Lymphocytes Absolute 1.5 0.7 - 3.1 x10E3/uL   Monocytes Absolute 0.8 0.1 - 0.9 x10E3/uL   EOS (ABSOLUTE) 0.5 (H) 0.0 - 0.4  x10E3/uL   Basophils Absolute 0.1 0.0 - 0.2 x10E3/uL   Immature Granulocytes 0 Not Estab. %   Immature Grans (Abs) 0.0 0.0 - 0.1 x10E3/uL  Comprehensive metabolic panel  Result Value Ref Range   Glucose 88 70 - 99 mg/dL   BUN 9 6 - 20 mg/dL   Creatinine, Ser 0.73 0.57 - 1.00 mg/dL   eGFR 110 >59 mL/min/1.73   BUN/Creatinine Ratio 12 9 - 23   Sodium 142 134 - 144 mmol/L   Potassium 4.7 3.5 - 5.2 mmol/L   Chloride 104 96 - 106 mmol/L   CO2 23 20 - 29 mmol/L   Calcium 9.2 8.7 - 10.2 mg/dL   Total Protein 6.6 6.0 - 8.5 g/dL   Albumin 4.3 3.8 - 4.8 g/dL   Globulin, Total 2.3 1.5 - 4.5 g/dL   Albumin/Globulin Ratio 1.9 1.2 - 2.2   Bilirubin Total 0.4 0.0 - 1.2 mg/dL   Alkaline Phosphatase 80 44 - 121 IU/L   AST 15 0 - 40 IU/L   ALT 12 0 - 32 IU/L  Hemoglobin A1c  Result Value Ref Range   Hgb A1c MFr Bld 5.1 4.8 - 5.6 %   Est. average glucose Bld gHb Est-mCnc  100 mg/dL  Lipid panel  Result Value Ref Range   Cholesterol, Total 161 100 - 199 mg/dL   Triglycerides 73 0 - 149 mg/dL   HDL 54 >39 mg/dL   VLDL Cholesterol Cal 14 5 - 40 mg/dL   LDL Chol Calc (NIH) 93 0 - 99 mg/dL   Chol/HDL Ratio 3.0 0.0 - 4.4 ratio  Thyroid Panel With TSH  Result Value Ref Range   TSH 1.510 0.450 - 4.500 uIU/mL   T4, Total 9.0 4.5 - 12.0 ug/dL   T3 Uptake Ratio 28 24 - 39 %   Free Thyroxine Index 2.5 1.2 - 4.9  VITAMIN D 25 Hydroxy (Vit-D Deficiency, Fractures)  Result Value Ref Range   Vit D, 25-Hydroxy 30.1 30.0 - 100.0 ng/mL    Assessment & Plan      Problem List Items Addressed This Visit     Mild persistent asthma, uncomplicated    Chronic.  Currently well controlled at this time.  Patient has made dietary changes which have improved her symptoms.  Recommend continuation of monitoring triggers for asthma.  Monitor for any new or worsening symptoms and notify immediately.      Encounter for annual physical exam - Primary    New patient today presenting for annual physical exam.  No  concerning findings present on exam today.  We will obtain labs for further evaluation.  Review of health maintenance today shows patient is up-to-date.  We will obtain previous records and update the information in the chart. We will make changes to plan of care based on findings as appropriate with labs today.      Relevant Orders   CBC With Diff/Platelet (Completed)   Comprehensive metabolic panel (Completed)   Hemoglobin A1c (Completed)   Lipid panel (Completed)   Thyroid Panel With TSH (Completed)   VITAMIN D 25 Hydroxy (Vit-D Deficiency, Fractures) (Completed)   Other Visit Diagnoses     Healthcare maintenance       Relevant Orders   CBC With Diff/Platelet (Completed)   Comprehensive metabolic panel (Completed)   Hemoglobin A1c (Completed)   Lipid panel (Completed)   Thyroid Panel With TSH (Completed)   VITAMIN D 25 Hydroxy (Vit-D Deficiency, Fractures) (Completed)        Return in about 1 year (around 12/11/2022) for CPE today.      Lyndell Allaire, Coralee Pesa, NP, DNP, AGNP-C Primary Care & Sports Medicine at [place]

## 2021-12-10 NOTE — Patient Instructions (Signed)
Thank you for choosing [place] at [facility] for your Primary Care needs. I am excited for the opportunity to partner with you to meet your health care goals. It was a pleasure meeting you today!  Recommendations from today's visit: Things look great today!! I will let you know what your labs show and if we need to make any changes I will let you know.  If you feel like your knee is bothering you more or getting worse, please let me know and I can send a referral to the orthopedist   Information on diet, exercise, and health maintenance recommendations are listed below. This is information to help you be sure you are on track for optimal health and monitoring.   Please look over this and let us know if you have any questions or if you have completed any of the health maintenance outside of [place] so that we can be sure your records are up to date.  ___________________________________________________________ About Me: I am an Adult-Geriatric Nurse Practitioner with a background in caring for patients for more than 20 years with a strong intensive care background. I provide primary care and sports medicine services to patients age 78 and older within this office. My education had a strong focus on caring for the older adult population, which I am passionate about. I am also the director of the APP Fellowship with [facility].   My desire is to provide you with the best service through preventive medicine and supportive care. I consider you a part of the medical team and value your input. I work diligently to ensure that you are heard and your needs are met in a safe and effective manner. I want you to feel comfortable with me as your provider and want you to know that your health concerns are important to me.  For your information, our office hours are: Monday, Tuesday, and Thursday 8:00 AM - 5:00 PM Wednesday and Friday 8:00 AM - 12:00 PM.   In my time away from the  office I am teaching new APP's within the system and am unavailable, but my partner, Dr. Burnard Bunting is in the office for emergent needs.   If you have questions or concerns, please call our office at [phone] or send [place] a MyChart message and we will respond as quickly as possible.  ____________________________________________________________ MyChart:  For all urgent or time sensitive needs we ask that you please call the office to avoid delays. Our number is [phone]. MyChart is not constantly monitored and due to the large volume of messages a day, replies may take up to 72 business hours.  MyChart Policy: MyChart allows for you to see your visit notes, after visit summary, provider recommendations, lab and tests results, make an appointment, request refills, and contact your provider or the office for non-urgent questions or concerns. Providers are seeing patients during normal business hours and do not have built in time to review MyChart messages.  We ask that you allow a minimum of 3 business days for responses to [employer]. For this reason, please do not send urgent requests through [place]. Please call the office at [phone]. New and ongoing conditions may require a visit. We have virtual and in person visit available for your convenience.  Complex MyChart concerns may require a visit. Your provider may request you schedule a virtual or in person visit to ensure we are providing the best care possible. MyChart messages sent after 11:00 AM on Friday  will not be received by the provider until Monday morning.    Lab and Test Results: You will receive your lab and test results on MyChart as soon as they are completed and results have been sent by the lab or testing facility. Due to this service, you will receive your results BEFORE your provider.  I review lab and tests results each morning prior to seeing patients. Some results require collaboration with other providers to  ensure you are receiving the most appropriate care. For this reason, we ask that you please allow a minimum of 3-5 business days from the time the ALL results have been received for your provider to receive and review lab and test results and contact you about these.  Most lab and test result comments from the provider will be sent through [place]. Your provider may recommend changes to the plan of care, follow-up visits, repeat testing, ask questions, or request an office visit to discuss these results. You may reply directly to this message or call the office at [phone] to provide information for the provider or set up an appointment. In some instances, you will be called with test results and recommendations. Please let us know if this is preferred and we will make note of this in your chart to provide this for you.    If you have not heard a response to your lab or test results in 5 business days from all results returning to [place], please call the office to let us know. We ask that you please avoid calling prior to this time unless there is an emergent concern. Due to high call volumes, this can delay the resulting process.  After Hours: For all non-emergency after hours needs, please call the office at [phone] and select the option to reach the on-call provider service. On-call services are shared between multiple [place] offices and therefore it will not be possible to speak directly with your provider. On-call providers may provide medical advice and recommendations, but are unable to provide refills for maintenance medications.  For all emergency or urgent medical needs after normal business hours, we recommend that you seek care at the closest Urgent Care or Emergency Department to ensure appropriate treatment in a timely manner.  [facility] [place] has a 24 hour emergency room located on the ground floor for your convenience.   Urgent Concerns During the Business  Day Providers are seeing patients from 8AM to [place] with a busy schedule and are most often not able to respond to non-urgent calls until the end of the day or the next business day. If you should have URGENT concerns during the day, please call and speak to the nurse or schedule a same day appointment so that we can address your concern without delay.   Thank you, again, for choosing me as your health care partner. I appreciate your trust and look forward to learning more about you.   Worthy Keeler, DNP, AGNP-c ___________________________________________________________  Health Maintenance Recommendations Screening Testing Mammogram Every 1 -2 years based on history and risk factors Starting at age 67 Pap Smear Ages 21-39 every 3 years Ages 22-65 every 5 years with HPV testing More frequent testing may be required based on results and history Colon Cancer Screening Every 1-10 years based on test performed, risk factors, and history Starting at age 23 Bone Density Screening Every 2-10 years based on history Starting at age 50 for women Recommendations for men differ based on medication usage, history, and  risk factors AAA Screening One time ultrasound Men 8-47 years old who have every smoked Lung Cancer Screening Low Dose Lung CT every 12 months Age 35-80 years with a 30 pack-year smoking history who still smoke or who have quit within the last 15 years  Screening Labs Routine  Labs: Complete Blood Count (CBC), Complete Metabolic Panel (CMP), Cholesterol (Lipid Panel) Every 6-12 months based on history and medications May be recommended more frequently based on current conditions or previous results Hemoglobin A1c Lab Every 3-12 months based on history and previous results Starting at age 82 or earlier with diagnosis of diabetes, high cholesterol, BMI >26, and/or risk factors Frequent monitoring for patients with diabetes to ensure blood sugar control Thyroid Panel (TSH w/ T3  & T4) Every 6 months based on history, symptoms, and risk factors May be repeated more often if on medication HIV One time testing for all patients 34 and older May be repeated more frequently for patients with increased risk factors or exposure Hepatitis C One time testing for all patients 27 and older May be repeated more frequently for patients with increased risk factors or exposure Gonorrhea, Chlamydia Every 12 months for all sexually active persons 13-24 years Additional monitoring may be recommended for those who are considered high risk or who have symptoms PSA Men 7-64 years old with risk factors Additional screening may be recommended from age 65-69 based on risk factors, symptoms, and history  Vaccine Recommendations Tetanus Booster All adults every 10 years Flu Vaccine All patients 6 months and older every year COVID Vaccine All patients 12 years and older Initial dosing with booster May recommend additional booster based on age and health history HPV Vaccine 2 doses all patients age 58-26 Dosing may be considered for patients over 26 Shingles Vaccine (Shingrix) 2 doses all adults 39 years and older Pneumonia (Pneumovax 23) All adults 30 years and older May recommend earlier dosing based on health history Pneumonia (Prevnar 13) All adults 37 years and older Dosed 1 year after Pneumovax 23  Additional Screening, Testing, and Vaccinations may be recommended on an individualized basis based on family history, health history, risk factors, and/or exposure.  __________________________________________________________  Diet Recommendations for All Patients  I recommend that all patients maintain a diet low in saturated fats, carbohydrates, and cholesterol. While this can be challenging at first, it is not impossible and small changes can make big differences.  Things to try: Decreasing the amount of soda, sweet tea, and/or juice to one or less per day and replace with  water While water is always the first choice, if you do not like water you may consider adding a water additive without sugar to improve the taste other sugar free drinks Replace potatoes with a brightly colored vegetable at dinner Use healthy oils, such as canola oil or olive oil, instead of butter or hard margarine Limit your bread intake to two pieces or less a day Replace regular pasta with low carb pasta options Bake, broil, or grill foods instead of frying Monitor portion sizes  Eat smaller, more frequent meals throughout the day instead of large meals  An important thing to remember is, if you love foods that are not great for your health, you don't have to give them up completely. Instead, allow these foods to be a reward when you have done well. Allowing yourself to still have special treats every once in a while is a nice way to tell yourself thank you for working hard to keep  yourself healthy.   Also remember that every day is a new day. If you have a bad day and "fall off the wagon", you can still climb right back up and keep moving along on your journey!  We have resources available to help you!  Some websites that may be helpful include: www.http://carter.biz/  Www.VeryWellFit.com _____________________________________________________________  Activity Recommendations for All Patients  I recommend that all adults get at least 20 minutes of moderate physical activity that elevates your heart rate at least 5 days out of the week.  Some examples include: Walking or jogging at a pace that allows you to carry on a conversation Cycling (stationary bike or outdoors) Water aerobics Yoga Weight lifting Dancing If physical limitations prevent you from putting stress on your joints, exercise in a pool or seated in a chair are excellent options.  Do determine your MAXIMUM heart rate for activity: YOUR AGE - 220 = MAX HeartRate   Remember! Do not push yourself too hard.  Start slowly  and build up your pace, speed, weight, time in exercise, etc.  Allow your body to rest between exercise and get good sleep. You will need more water than normal when you are exerting yourself. Do not wait until you are thirsty to drink. Drink with a purpose of getting in at least 8, 8 ounce glasses of water a day plus more depending on how much you exercise and sweat.    If you begin to develop dizziness, chest pain, abdominal pain, jaw pain, shortness of breath, headache, vision changes, lightheadedness, or other concerning symptoms, stop the activity and allow your body to rest. If your symptoms are severe, seek emergency evaluation immediately. If your symptoms are concerning, but not severe, please let us know so that we can recommend further evaluation.

## 2021-12-11 ENCOUNTER — Encounter (HOSPITAL_BASED_OUTPATIENT_CLINIC_OR_DEPARTMENT_OTHER): Payer: Self-pay | Admitting: Nurse Practitioner

## 2021-12-11 LAB — LIPID PANEL
Chol/HDL Ratio: 3 ratio (ref 0.0–4.4)
Cholesterol, Total: 161 mg/dL (ref 100–199)
HDL: 54 mg/dL (ref >39)
LDL Chol Calc (NIH): 93 mg/dL (ref 0–99)
Triglycerides: 73 mg/dL (ref 0–149)
VLDL Cholesterol Cal: 14 mg/dL (ref 5–40)

## 2021-12-11 LAB — THYROID PANEL WITH TSH
Free Thyroxine Index: 2.5 (ref 1.2–4.9)
T3 Uptake Ratio: 28 % (ref 24–39)
T4, Total: 9 ug/dL (ref 4.5–12.0)
TSH: 1.51 u[IU]/mL (ref 0.450–4.500)

## 2021-12-11 LAB — HEMOGLOBIN A1C
Est. average glucose Bld gHb Est-mCnc: 100 mg/dL
Hgb A1c MFr Bld: 5.1 % (ref 4.8–5.6)

## 2021-12-11 LAB — COMPREHENSIVE METABOLIC PANEL
ALT: 12 IU/L (ref 0–32)
AST: 15 IU/L (ref 0–40)
Albumin/Globulin Ratio: 1.9 (ref 1.2–2.2)
Albumin: 4.3 g/dL (ref 3.8–4.8)
Alkaline Phosphatase: 80 IU/L (ref 44–121)
BUN/Creatinine Ratio: 12 (ref 9–23)
BUN: 9 mg/dL (ref 6–20)
Bilirubin Total: 0.4 mg/dL (ref 0.0–1.2)
CO2: 23 mmol/L (ref 20–29)
Calcium: 9.2 mg/dL (ref 8.7–10.2)
Chloride: 104 mmol/L (ref 96–106)
Creatinine, Ser: 0.73 mg/dL (ref 0.57–1.00)
Globulin, Total: 2.3 g/dL (ref 1.5–4.5)
Glucose: 88 mg/dL (ref 70–99)
Potassium: 4.7 mmol/L (ref 3.5–5.2)
Sodium: 142 mmol/L (ref 134–144)
Total Protein: 6.6 g/dL (ref 6.0–8.5)
eGFR: 110 mL/min/{1.73_m2} (ref >59)

## 2021-12-11 LAB — CBC WITH DIFF/PLATELET
Basophils Absolute: 0.1 10*3/uL (ref 0.0–0.2)
Basos: 1 %
EOS (ABSOLUTE): 0.5 10*3/uL — ABNORMAL HIGH (ref 0.0–0.4)
Eos: 6 %
Hematocrit: 40.8 % (ref 34.0–46.6)
Hemoglobin: 13.3 g/dL (ref 11.1–15.9)
Immature Grans (Abs): 0 10*3/uL (ref 0.0–0.1)
Immature Granulocytes: 0 %
Lymphocytes Absolute: 1.5 10*3/uL (ref 0.7–3.1)
Lymphs: 18 %
MCH: 29.6 pg (ref 26.6–33.0)
MCHC: 32.6 g/dL (ref 31.5–35.7)
MCV: 91 fL (ref 79–97)
Monocytes Absolute: 0.8 10*3/uL (ref 0.1–0.9)
Monocytes: 10 %
Neutrophils Absolute: 5.2 10*3/uL (ref 1.4–7.0)
Neutrophils: 65 %
Platelets: 278 10*3/uL (ref 150–450)
RBC: 4.49 x10E6/uL (ref 3.77–5.28)
RDW: 12.7 % (ref 11.7–15.4)
WBC: 8 10*3/uL (ref 3.4–10.8)

## 2021-12-11 LAB — VITAMIN D 25 HYDROXY (VIT D DEFICIENCY, FRACTURES): Vit D, 25-Hydroxy: 30.1 ng/mL (ref 30.0–100.0)

## 2021-12-14 ENCOUNTER — Encounter (HOSPITAL_BASED_OUTPATIENT_CLINIC_OR_DEPARTMENT_OTHER): Payer: Self-pay | Admitting: Nurse Practitioner

## 2021-12-14 NOTE — Telephone Encounter (Signed)
Called pt and sch appt for 6/27 for consult and possible injection.

## 2021-12-21 NOTE — Progress Notes (Signed)
NEW PATIENT Date of Service/Encounter:  12/23/21 Referring provider: Karren Burly, MD Primary care provider: Tollie Eth, NP  Subjective:  Cindy Dunn is a 36 y.o. female with a PMHx of Raynaud's disease presenting today for evaluation of allergic rhinitis and asthma and concern for dairy allergy. History obtained from: chart review and patient.   Patient reports concern for possible dairy allergy.  She is having coughing and chest tightness after eating dairy. She was going into a full blown asthma attack consisently following dairy exposure.  She cut it out of her diet, and attacks resolved.  She does not have these episodes with baked dairy.  She doesn't eat baked dairy often. She had skin testing done on April 24th at [facility].  She was told it was negative to dairy and that avoidance was optional.  No follow-up labs obtained.  She did have environmental retesting at [facility]  on this same date.  She was allergic to DM and several pollens.  Does not remember details. Mom concerned that she had the same testing that she had 5 years prior.   ARC: She started  AIT in 2016, but stopped for a period of time due to insurance changes.  She restarted allergy injections for about one and a half years ago.  However, family and patient do not feel a difference in her symptoms since re-starting. She continues to have sneezing and coughing. She does take a doterra oil capsule twice daily containing lavender-lemon and peppermint oil, and this helps more with sneezing than the injections. She takes clarinex daily, Flonase twice a day, every other day, astelin using as needed.  Not using Pataday, but uses rewetting drops for contacts.   Despite these measures, she has sneezing, coughing and wheezing.  Cough will get so bad to the point she will vomit.    Asthma: She does feel her asthma is controlled since stopping dairy.  She uses Advair 500 1 puff twice per day and this was given by her PCP  office in about March after having a flare of asthma following dairy consumption. Using rescue inhaler only about once per month.  She is a transfer from [facility] allergy and was on allergy injections.  Using desloratadine, montelukast, albuterol PRN, patanase nasal spray and azelastine as needed.   Per patient message on 10/26/2021 patient having issues with dairy with negative dairy skin testing recently, requesting second opinion.    Past Medical History: Past Medical History:  Diagnosis Date   Asthma    Medication List:  Current Outpatient Medications  Medication Sig Dispense Refill   albuterol (VENTOLIN HFA) 108 (90 Base) MCG/ACT inhaler Inhale 2 puffs into the lungs every 4 to 6 hours as needed 18 g 0   azelastine (ASTELIN) 0.1 % nasal spray Place 1 spray into each nostril twice a day 30 mL 5   desloratadine (CLARINEX) 5 MG tablet Take 1 tablet (5 mg total) by mouth daily. 90 tablet 3   EPINEPHrine 0.3 mg/0.3 mL IJ SOAJ injection Inject into the muscle.     fluticasone (FLONASE) 50 MCG/ACT nasal spray Place 1 spray into both nostrils in the morning and at bedtime. 16 g 6   fluticasone-salmeterol (ADVAIR DISKUS) 500-50 MCG/ACT AEPB Inhale 1 puff into the lungs in the morning and at bedtime. 60 each 6   Olopatadine HCl 0.6 % SOLN 2 sprays in each nostril     No current facility-administered medications for this visit.   Known Allergies:  No  Known Allergies Past Surgical History: No past surgical history on file. Family History: No family history on file. Social History: Irlanda lives in a house built 3 years ago, no water damage, hardwood floors, + heat pump, central AC, indoor dog, no cockroaches, using dust mite protection on bedding and pillows, she is a CNA in the urology department working at [employer], no HEPA filter in the home.  [place].  Denies previous smoking use.   ROS:  All other systems negative except as noted per  HPI.  Objective:  Blood pressure 112/74, pulse 65, temperature 98.2 F (36.8 C), height 5' 2.5" (1.588 m), weight 147 lb 3.2 oz (66.8 kg), SpO2 94 %. Body mass index is 26.49 kg/m. Physical Exam:  General Appearance:  Alert, cooperative, no distress, appears stated age  Head:  Normocephalic, without obvious abnormality, atraumatic  Eyes:  Conjunctiva clear, EOM's intact  Nose: Nares normal, hypertrophic turbinates, normal mucosa, and no visible anterior polyps  Throat: Lips, tongue normal; teeth and gums normal, normal posterior oropharynx  Neck: Supple, symmetrical  Lungs:   clear to auscultation bilaterally, Respirations unlabored, no coughing  Heart:  regular rate and rhythm and no murmur, Appears well perfused  Extremities: No edema  Skin: Skin color, texture, turgor normal, no rashes or lesions on visualized portions of skin  Neurologic: No gross deficits     Diagnostics: Spirometry:  Tracings reviewed. Her effort: Good reproducible efforts. FVC: 1.89L  FEV1: 1.94L, 66% predicted  FEV1/FVC ratio: 123%  Interpretation:  Ratio shows a nonobstructive pattern , possible restriction  Assessment and Plan  Patient is having consistent symptoms concerning for flare of asthma/allergic reaction following unbaked dairy consumption.  Discussed that her symptoms are concerning for allergic reaction, and therefore she should avoid unbaked dairy.  We will obtain lab testing to confirm negative skin test results.  Discussed the difference between type I reactions versus food intolerances.  Either way, based on the reproducibility and type of symptoms she is having, would suggest absolute avoidance of unbaked dairy  Regarding her allergic rhinitis, her symptoms are not controlled.  We discussed avoidance measures plus medical management with symptomatic care using current regimen as well as allergy injections.  If she decides to pursue allergy injections with the goal of long-term tolerance, we  can either have [place] continue to makes for her, or we can start from new using her own allergen vials.  She would like to start a fresh.  We discussed [place] versus traditional build up as an option for her.  We will obtain lab test today as well as her records from [facility].  Regarding her asthma, sounds overall controlled with the use of Advair twice daily in combination with avoidance of dairy.  Using Adviar discus which may contain milk powder, but not noting any symptoms with this, so we will continue for now.   Food allergy:  - blood work today for milk  - please strictly avoid UNBAKED DAIRY - okay to eat baked dairy and dairy not listed in the top 3 ingredients - for SKIN + ANY additional symptoms, OR IF concern for LIFE THREATENING reaction = Epipen Autoinjector EpiPen 0.3 mg. - If using Epinephrine autoinjector, call 911 - A food allergy action plan has been provided and discussed. - Medic Alert identification is recommended.  Allergic Rhinitis: - labs work for environmental allergens, will obtain previous skin test from [facility] - allergen avoidance as below - consider allergy shots as long term control  of your symptoms by teaching your immune system to be more tolerant of your allergy triggers - Continue Nasal Steroid Spray: Options include Flonase (fluticasone), Nasocort (triamcinolone), Nasonex (mometasome) 1- 2 sprays in each nostril daily (can buy over-the-counter if not covered by insurance)  Best results if used daily. - Continue Astelin (Azelastine) 1-2 sprays in each nostril twice a day as needed.  You may use this as needed for nasal congestion/itchy ears/itchy nose if desired - Continue Clarinex daily.  Allergic Conjunctivitis:  - Continue Allergy Eye drops: great options include Pataday (Olopatadine) or Zaditor (ketotifen) for eye symptoms daily as needed-both sold over the counter if not covered by insurance.   -Avoid eye drops that say red eye relief as they may contain  medications that dry out your eyes.  Mild persistent Asthma: - your lung testing today did not show obstruction - Controller Inhaler: Continue Advair 500 1 puff twice a day; This Should Be Used Everyday - Rinse mouth out after use - Rescue Inhaler: Albuterol (Proair/Ventolin) 2 puffs . Use  every 4-6 hours as needed for chest tightness, wheezing, or coughing.  Can also use 15 minutes prior to exercise if you have symptoms with activity. - Asthma is not controlled if:  - Symptoms are occurring >2 times a week OR  - >2 times a month nighttime awakenings  - You are requiring systemic steroids (prednisone/steroid injections) more than once per year  - Your require hospitalization for your asthma.  - Please call the clinic to schedule a follow up if these symptoms arise  Follow-up in 3 months, sooner if needed. It was a pleasure meeting you today! Sigurd Sos, MD [facility]   This note in its entirety was forwarded to the Provider who requested this consultation.  Thank you for your kind referral. I appreciate the opportunity to take part in Dalores's care. Please do not hesitate to contact me with questions.  Sincerely,  Sigurd Sos, MD [facility] [place] [facility] [place]

## 2021-12-22 ENCOUNTER — Encounter (HOSPITAL_BASED_OUTPATIENT_CLINIC_OR_DEPARTMENT_OTHER): Payer: Self-pay | Admitting: Family Medicine

## 2021-12-22 ENCOUNTER — Ambulatory Visit (INDEPENDENT_AMBULATORY_CARE_PROVIDER_SITE_OTHER): Payer: 59 | Admitting: Family Medicine

## 2021-12-22 VITALS — BP 111/70 | HR 61 | Temp 97.6°F | Ht 62.5 in | Wt 148.2 lb

## 2021-12-22 DIAGNOSIS — G8929 Other chronic pain: Secondary | ICD-10-CM | POA: Diagnosis not present

## 2021-12-22 DIAGNOSIS — M25561 Pain in right knee: Secondary | ICD-10-CM | POA: Diagnosis not present

## 2021-12-22 DIAGNOSIS — M222X1 Patellofemoral disorders, right knee: Secondary | ICD-10-CM | POA: Insufficient documentation

## 2021-12-23 ENCOUNTER — Ambulatory Visit (INDEPENDENT_AMBULATORY_CARE_PROVIDER_SITE_OTHER): Payer: 59 | Admitting: Internal Medicine

## 2021-12-23 ENCOUNTER — Encounter: Payer: Self-pay | Admitting: Internal Medicine

## 2021-12-23 VITALS — BP 112/74 | HR 65 | Temp 98.2°F | Ht 62.5 in | Wt 147.2 lb

## 2021-12-23 DIAGNOSIS — J4530 Mild persistent asthma, uncomplicated: Secondary | ICD-10-CM

## 2021-12-23 DIAGNOSIS — J3089 Other allergic rhinitis: Secondary | ICD-10-CM

## 2021-12-23 DIAGNOSIS — M222X1 Patellofemoral disorders, right knee: Secondary | ICD-10-CM | POA: Diagnosis not present

## 2021-12-23 DIAGNOSIS — T781XXA Other adverse food reactions, not elsewhere classified, initial encounter: Secondary | ICD-10-CM | POA: Diagnosis not present

## 2021-12-23 DIAGNOSIS — H1013 Acute atopic conjunctivitis, bilateral: Secondary | ICD-10-CM | POA: Insufficient documentation

## 2021-12-23 DIAGNOSIS — J302 Other seasonal allergic rhinitis: Secondary | ICD-10-CM | POA: Diagnosis not present

## 2021-12-23 NOTE — Patient Instructions (Signed)
Food allergy:  - blood work today for milk  - please strictly avoid UNBAKED DAIRY - okay to eat baked dairy and dairy not listed in the top 3 ingredients - for SKIN + ANY additional symptoms, OR IF concern for LIFE THREATENING reaction = Epipen Autoinjector EpiPen 0.3 mg. - If using Epinephrine autoinjector, call 911 - A food allergy action plan has been provided and discussed. - Medic Alert identification is recommended.  Allergic Rhinitis: - labs work for environmental allergens, will obtain previous skin test from [facility] - allergen avoidance as below - consider allergy shots as long term control of your symptoms by teaching your immune system to be more tolerant of your allergy triggers - Continue Nasal Steroid Spray: Options include Flonase (fluticasone), Nasocort (triamcinolone), Nasonex (mometasome) 1- 2 sprays in each nostril daily (can buy over-the-counter if not covered by insurance)  Best results if used daily. - Continue Astelin (Azelastine) 1-2 sprays in each nostril twice a day as needed.  You may use this as needed for nasal congestion/itchy ears/itchy nose if desired - Continue Clarinex daily.  Allergic Conjunctivitis:  - Continue Allergy Eye drops: great options include Pataday (Olopatadine) or Zaditor (ketotifen) for eye symptoms daily as needed-both sold over the counter if not covered by insurance.   -Avoid eye drops that say red eye relief as they may contain medications that dry out your eyes.  Mild persistent Asthma: - your lung testing today did not show obstruction - Controller Inhaler: Continue Advair 500 1 puff twice a day; This Should Be Used Everyday - Rinse mouth out after use - Rescue Inhaler: Albuterol (Proair/Ventolin) 2 puffs . Use  every 4-6 hours as needed for chest tightness, wheezing, or coughing.  Can also use 15 minutes prior to exercise if you have symptoms with activity. - Asthma is not controlled if:  - Symptoms are occurring >2 times a week OR  -  >2 times a month nighttime awakenings  - You are requiring systemic steroids (prednisone/steroid injections) more than once per year  - Your require hospitalization for your asthma.  - Please call the clinic to schedule a follow up if these symptoms arise  Follow-up in 3 months, sooner if needed. It was a pleasure meeting you today! Tonny Bollman, MD [facility]

## 2021-12-24 ENCOUNTER — Ambulatory Visit (HOSPITAL_BASED_OUTPATIENT_CLINIC_OR_DEPARTMENT_OTHER): Payer: 59

## 2021-12-24 DIAGNOSIS — Z0000 Encounter for general adult medical examination without abnormal findings: Secondary | ICD-10-CM | POA: Insufficient documentation

## 2021-12-24 NOTE — Assessment & Plan Note (Signed)
New patient today presenting for annual physical exam.  No concerning findings present on exam today.  We will obtain labs for further evaluation.  Review of health maintenance today shows patient is up-to-date.  We will obtain previous records and update the information in the chart. We will make changes to plan of care based on findings as appropriate with labs today.

## 2021-12-24 NOTE — Assessment & Plan Note (Signed)
Chronic.  Currently well controlled at this time.  Patient has made dietary changes which have improved her symptoms.  Recommend continuation of monitoring triggers for asthma.  Monitor for any new or worsening symptoms and notify immediately.

## 2021-12-26 LAB — ALLERGEN, CASEIN, F78: F078-IgE Casein: <0.1 kU/L

## 2021-12-27 LAB — ALLERGENS W/TOTAL IGE AREA 2

## 2021-12-27 LAB — ALLERGEN MILK: Milk IgE: <0.1 kU/L

## 2021-12-28 ENCOUNTER — Ambulatory Visit (INDEPENDENT_AMBULATORY_CARE_PROVIDER_SITE_OTHER): Payer: 59

## 2021-12-28 DIAGNOSIS — G8929 Other chronic pain: Secondary | ICD-10-CM | POA: Diagnosis not present

## 2021-12-28 DIAGNOSIS — M25561 Pain in right knee: Secondary | ICD-10-CM

## 2021-12-30 ENCOUNTER — Telehealth: Payer: Self-pay

## 2021-12-30 NOTE — Progress Notes (Signed)
Please let Cindy Dunn know that her allergy testing to milk was negative, but based on her reaction history would advise strict avoidance.  Her environmental allergies are negative per blood work.  We can get her set up for skin testing to confirm. Sometimes this occurs as a result of successful allergy injections.    Let me know what she decides and if she has any questions.

## 2021-12-30 NOTE — Telephone Encounter (Signed)
Called patient - LMOVM to contact office to schedule skin testing appt. - available times: 02/17/22 8:30 am or 1:30 pm.

## 2021-12-30 NOTE — Telephone Encounter (Signed)
-----   Message from Verlee Monte, MD sent at 12/30/2021  1:13 PM EDT ----- Can you call to have her scheduled for allergy testing to [facility]?Remind her to stop antihistamines for 3 days prior to appointment please.  She called her insurance and it is covered.   Thanks!

## 2022-01-04 ENCOUNTER — Emergency Department (HOSPITAL_BASED_OUTPATIENT_CLINIC_OR_DEPARTMENT_OTHER): Payer: 59 | Admitting: Radiology

## 2022-01-04 ENCOUNTER — Other Ambulatory Visit: Payer: Self-pay

## 2022-01-04 ENCOUNTER — Emergency Department (HOSPITAL_BASED_OUTPATIENT_CLINIC_OR_DEPARTMENT_OTHER)
Admission: EM | Admit: 2022-01-04 | Discharge: 2022-01-04 | Disposition: A | Discharge status: Home / Self Care | Payer: 59 | Attending: Emergency Medicine | Admitting: Emergency Medicine

## 2022-01-04 ENCOUNTER — Other Ambulatory Visit (HOSPITAL_BASED_OUTPATIENT_CLINIC_OR_DEPARTMENT_OTHER): Payer: Self-pay

## 2022-01-04 ENCOUNTER — Emergency Department (HOSPITAL_BASED_OUTPATIENT_CLINIC_OR_DEPARTMENT_OTHER): Payer: 59

## 2022-01-04 ENCOUNTER — Encounter (HOSPITAL_BASED_OUTPATIENT_CLINIC_OR_DEPARTMENT_OTHER): Payer: Self-pay

## 2022-01-04 DIAGNOSIS — M546 Pain in thoracic spine: Secondary | ICD-10-CM | POA: Diagnosis not present

## 2022-01-04 DIAGNOSIS — Y92410 Unspecified street and highway as the place of occurrence of the external cause: Secondary | ICD-10-CM | POA: Diagnosis not present

## 2022-01-04 DIAGNOSIS — R0789 Other chest pain: Secondary | ICD-10-CM | POA: Diagnosis not present

## 2022-01-04 DIAGNOSIS — M545 Low back pain, unspecified: Secondary | ICD-10-CM | POA: Insufficient documentation

## 2022-01-04 DIAGNOSIS — M549 Dorsalgia, unspecified: Secondary | ICD-10-CM | POA: Diagnosis not present

## 2022-01-04 DIAGNOSIS — T1490XA Injury, unspecified, initial encounter: Secondary | ICD-10-CM | POA: Diagnosis not present

## 2022-01-04 LAB — CBC WITH DIFFERENTIAL/PLATELET
Abs Immature Granulocytes: 0.03 10*3/uL (ref 0.00–0.07)
Basophils Absolute: 0.1 10*3/uL (ref 0.0–0.1)
Basophils Relative: 1 %
Eosinophils Absolute: 0.8 10*3/uL — ABNORMAL HIGH (ref 0.0–0.5)
Eosinophils Relative: 8 %
HCT: 37.6 % (ref 36.0–46.0)
Hemoglobin: 12.2 g/dL (ref 12.0–15.0)
Immature Granulocytes: 0 %
Lymphocytes Relative: 21 %
Lymphs Abs: 1.9 10*3/uL (ref 0.7–4.0)
MCH: 29.3 pg (ref 26.0–34.0)
MCHC: 32.4 g/dL (ref 30.0–36.0)
MCV: 90.4 fL (ref 80.0–100.0)
Monocytes Absolute: 0.8 10*3/uL (ref 0.1–1.0)
Monocytes Relative: 9 %
Neutro Abs: 5.8 10*3/uL (ref 1.7–7.7)
Neutrophils Relative %: 61 %
Platelets: 266 10*3/uL (ref 150–400)
RBC: 4.16 MIL/uL (ref 3.87–5.11)
RDW: 13 % (ref 11.5–15.5)
WBC: 9.4 10*3/uL (ref 4.0–10.5)
nRBC: 0 % (ref 0.0–0.2)

## 2022-01-04 LAB — BASIC METABOLIC PANEL
Anion gap: 11 (ref 5–15)
BUN: 9 mg/dL (ref 6–20)
CO2: 25 mmol/L (ref 22–32)
Calcium: 9.4 mg/dL (ref 8.9–10.3)
Chloride: 104 mmol/L (ref 98–111)
Creatinine, Ser: 0.73 mg/dL (ref 0.44–1.00)
GFR, Estimated: >60 mL/min (ref >60)
Glucose, Bld: 82 mg/dL (ref 70–99)
Potassium: 3.7 mmol/L (ref 3.5–5.1)
Sodium: 140 mmol/L (ref 135–145)

## 2022-01-04 MED ORDER — HYDROCODONE-ACETAMINOPHEN 5-325 MG PO TABS
2.0000 | ORAL_TABLET | Freq: Once | ORAL | Status: AC
  Administered 2022-01-04: 2 via ORAL
  Filled 2022-01-04: qty 2

## 2022-01-04 MED ORDER — CYCLOBENZAPRINE HCL 10 MG PO TABS
10.0000 mg | ORAL_TABLET | Freq: Two times a day (BID) | ORAL | 0 refills | Status: DC | PRN
Start: 2022-01-04 — End: 2022-10-08
  Filled 2022-01-04: qty 20, 10d supply, fill #0

## 2022-01-04 MED ORDER — IOHEXOL 300 MG/ML  SOLN
100.0000 mL | Freq: Once | INTRAMUSCULAR | Status: AC | PRN
  Administered 2022-01-04: 75 mL via INTRAVENOUS

## 2022-01-04 NOTE — ED Notes (Signed)
Provider at the Bedside. 

## 2022-01-04 NOTE — ED Notes (Signed)
Patient returned from Radiology at this Time.
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
## 2022-01-04 NOTE — ED Notes (Signed)
Patient transported to Radiology at this Time. 

## 2022-01-04 NOTE — Discharge Instructions (Addendum)
Please follow-up with your primary care doctor for further evaluation.  All of your imaging was normal today which is reassuring.  You will be sore in the coming days.  You can take 600 mg ibuprofen every 6 hours for pain control.  Please take Flexeril twice daily as needed for muscle spasms.  Return to the emergency department for any worsening symptoms you might have.
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
## 2022-01-06 ENCOUNTER — Telehealth: Payer: Self-pay | Admitting: Internal Medicine

## 2022-01-06 NOTE — Telephone Encounter (Signed)
Encounter created for documentation purposes.  Received allergy records from [facility].  Last allergy skin test on 10/19/2021 negative to tree nuts, peanuts, wheat, cows milk, egg white, fish mix, shrimp, shellfish mix. Environmental allergy testing on that date showed 2+ reaction to pecan tree, Cottonwood, American beech, grasses: Orchard, fescue, weeds: Cocklebur, pigweed, molds: Penicillium, intradermal's 2+ to Rhizopus  Skin test on 04/06/2017 negative to environmental allergens  Skin test on 06/19/2014: 1/2+ reaction to: Oak mix, perennial rye, Timothy and 2+ reaction to Aspergillus fumigatus and Aspergillus Nigar as well as stemphylium.  IDs 2+ reaction to red mulberry,  alder, dust mites, mold mix 2. Food allergy testing on this date negative to tree nuts, wheat, vegetables, meats, dairy products, soy, egg, fish, shellfish  Spirometry from 2015 showed possible restriction with normal ratio-103% predicted, FVC 75% predicted and FEV1 78%.  Similar Spiros in 2016, 2018, 2019 and most recent in 2023 with a ratio of 106% and an FVC of 66% showing again a possible restriction  Reviewed patient records.  She was on allergy injections, followed for allergic rhinitis and asthma.  We will scan records to patient's chart
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
## 2022-01-20 ENCOUNTER — Encounter: Payer: Self-pay | Admitting: Internal Medicine

## 2022-01-20 ENCOUNTER — Ambulatory Visit: Payer: 59 | Admitting: Internal Medicine

## 2022-01-20 VITALS — BP 98/62 | HR 64 | Temp 97.6°F | Resp 16 | Ht 62.25 in | Wt 143.4 lb

## 2022-01-20 DIAGNOSIS — T781XXA Other adverse food reactions, not elsewhere classified, initial encounter: Secondary | ICD-10-CM | POA: Diagnosis not present

## 2022-01-20 DIAGNOSIS — J4530 Mild persistent asthma, uncomplicated: Secondary | ICD-10-CM | POA: Diagnosis not present

## 2022-01-20 DIAGNOSIS — H1013 Acute atopic conjunctivitis, bilateral: Secondary | ICD-10-CM | POA: Diagnosis not present

## 2022-01-20 DIAGNOSIS — J3089 Other allergic rhinitis: Secondary | ICD-10-CM

## 2022-01-20 DIAGNOSIS — T781XXD Other adverse food reactions, not elsewhere classified, subsequent encounter: Secondary | ICD-10-CM

## 2022-01-20 DIAGNOSIS — J302 Other seasonal allergic rhinitis: Secondary | ICD-10-CM

## 2022-01-20 NOTE — Patient Instructions (Signed)
Food allergy:  - blood work for milk negative - skin testing today for other common food allergens including dairy were negative except egg whites which were positive, however testing invalid due to inadequate controls. - please strictly avoid UNBAKED DAIRY and stovetop eggs- okay to eat baked dairy and eggs and dairy and egg not listed in the top 3 ingredients - for SKIN + ANY additional symptoms, OR IF concern for LIFE THREATENING reaction = Epipen Autoinjector EpiPen 0.3 mg. - If using Epinephrine autoinjector, call 911 - A food allergy action plan has been provided and discussed. - Medic Alert identification is recommended.  Allergic Rhinitis: - labs work for environmental allergens was negative -Return for repeat skin testing, discontinue all allergy related medications for at least 3 to 5 days prior to this visit - restart allergy injections - Continue Nasal Steroid Spray: Options include Flonase (fluticasone), Nasocort (triamcinolone), Nasonex (mometasome) 1- 2 sprays in each nostril daily (can buy over-the-counter if not covered by insurance)  Best results if used daily. - Continue Astelin (Azelastine) 1-2 sprays in each nostril twice a day as needed.  You may use this as needed for nasal congestion/itchy ears/itchy nose if desired - Continue Clarinex daily.  Allergic Conjunctivitis:  - Continue Allergy Eye drops: great options include Pataday (Olopatadine) or Zaditor (ketotifen) for eye symptoms daily as needed-both sold over the counter if not covered by insurance.   -Avoid eye drops that say red eye relief as they may contain medications that dry out your eyes.  Mild persistent Asthma: - Controller Inhaler: Continue Advair 500 1 puff twice a day; This Should Be Used Everyday - Rinse mouth out after use - Rescue Inhaler: Albuterol (Proair/Ventolin) 2 puffs . Use  every 4-6 hours as needed for chest tightness, wheezing, or coughing.  Can also use 15 minutes prior to exercise if you  have symptoms with activity. - Asthma is not controlled if:  - Symptoms are occurring >2 times a week OR  - >2 times a month nighttime awakenings  - You are requiring systemic steroids (prednisone/steroid injections) more than once per year  - Your require hospitalization for your asthma.  - Please call the clinic to schedule a follow up if these symptoms arise  Follow-up in 3 months, sooner if needed. It was a pleasure seeing you again today! Tonny Bollman, MD [facility]

## 2022-01-22 NOTE — Progress Notes (Unsigned)
FOLLOW UP Date of Service/Encounter:  01/27/22   Subjective:  Cindy Dunn (DOB: 1985-11-29) is a 36 y.o. female who [facility] in re-evaluation of the following:  allergic rhinitis, dairy and egg allergy, asthma History obtained from: chart review and patient and mother.  For Review, LV was on 01/20/22 with Dr.Terrace Chiem seen for routine follow-up and to undergo skin testing.  However, her controls were not adequate so today presents for repeat skin testing. Her testing did show positive to mixed feathers, and egg white at last visit. Reported a recent history of generalized hives after eating egg sandwich. No exposures to Egg or dairy since last visit. ------------------------------------------------------------------------ Pertinent History/Diagnostics:  - Asthma: exacerbated by dairy consumption.  Using Advair 500 1 puff twice per day  - Allergic Rhinitis: Started AIT in 2016, stopped at the time due to insurance, restarted 1.5 years ago.  Feels most relief for Doterra essential oil capsule for allergies.                 - igE environmental panel (12/23/21): negative - Food Intolerance (dairy-wheezing and asthma flare immediately after consumption; okay with baked dairy                - IgE testing on 12/23/21: negative  Allergies as of 01/27/2022       Reactions   Milk Protein Shortness Of Breath   ASTHMA FLARES        Medication List        Accurate as of January 27, 2022 12:21 PM. If you have any questions, ask your nurse or doctor.          Advair Diskus 500-50 MCG/ACT Aepb Generic drug: fluticasone-salmeterol Inhale 1 puff into the lungs in the morning and at bedtime.   albuterol 108 (90 Base) MCG/ACT inhaler Commonly known as: VENTOLIN HFA Inhale 2 puffs into the lungs every 4 to 6 hours as needed   azelastine 0.1 % nasal spray Commonly known as: ASTELIN Place 1 spray into each nostril twice a day   cyclobenzaprine  10 MG tablet Commonly known as: FLEXERIL Take 1 tablet (10 mg total) by mouth 2 (two) times daily as needed for muscle spasms.   desloratadine 5 MG tablet Commonly known as: Clarinex Take 1 tablet (5 mg total) by mouth daily.   EPINEPHrine 0.3 mg/0.3 mL Soaj injection Commonly known as: EPI-PEN Inject into the muscle.   fluticasone 50 MCG/ACT nasal spray Commonly known as: FLONASE Place 1 spray into both nostrils in the morning and at bedtime.   Olopatadine HCl 0.6 % Soln 2 sprays in each nostril       Past Medical History:  Diagnosis Date   Asthma    No past surgical history on file. Otherwise, there have been no changes to her past medical history, surgical history, family history, or social history.  ROS: All others negative except as noted per HPI.   Objective:  No exam today-testing appointment only  Skin Testing: Environmental allergy panel and select foods. Adequate positive and negative controls Results discussed with patient/family.  Airborne Adult Perc - 01/27/22 0951     Time Antigen Placed 5809    Allergen Manufacturer Waynette Buttery    Location Back    Number of Test 59    1. Control-Buffer 50% Glycerol Negative    2. Control-Histamine 1 mg/ml 3+    3. Albumin saline Negative    4. [place] Negative  5. [place] Negative    6. Johnson Negative    7. [place] Blue Negative    8. Meadow Fescue Negative    9. Perennial Rye Negative    10. Sweet Vernal Negative    11. Timothy 2+    12. Cocklebur Negative    13. Burweed Marshelder Negative    14. Ragweed, short Negative    15. Ragweed, Giant Negative    16. Plantain,  English Negative    17. Lamb's Quarters Negative    18. Sheep Sorrell Negative    19. Rough Pigweed Negative    20. Marsh Elder, Rough Negative    21. Mugwort, Common Negative    22. Ash mix Negative    23. Birch mix Negative    24. Beech American Negative    25. Box, Elder Negative    26. Cedar, red Negative    27. Cottonwood, [place]  Negative    28. Elm mix Negative    29. Hickory Negative    30. Maple mix Negative    31. Oak, [place] mix Negative    32. Pecan Pollen Negative    33. Pine mix Negative    34. Sycamore Eastern Negative    35. Walnut, Black Pollen Negative    36. Alternaria alternata Negative    37. Cladosporium Herbarum Negative    38. Aspergillus mix Negative    39. Penicillium mix Negative    40. Bipolaris sorokiniana (Helminthosporium) Negative    41. Drechslera spicifera (Curvularia) Negative    42. Mucor plumbeus Negative    43. Fusarium moniliforme Negative    44. Aureobasidium pullulans (pullulara) Negative    45. Rhizopus oryzae Negative    46. Botrytis cinera Negative    47. Epicoccum nigrum Negative    48. Phoma betae Negative    49. Candida Albicans Negative    50. Trichophyton mentagrophytes Negative    51. Mite, D Farinae  5,000 AU/ml Negative    52. Mite, D Pteronyssinus  5,000 AU/ml Negative    53. Cat Hair 10,000 BAU/ml Negative    54.  Dog Epithelia Negative    55. Mixed Feathers Negative    56. Horse Epithelia Negative    57. Cockroach, German Negative    58. Mouse Negative    59. Tobacco Leaf Negative             Food Perc - 01/27/22 0951       Test Information   Time Antigen Placed 5093    Allergen Manufacturer Waynette Buttery    Location Back    Number of allergen test 10      Food   1. Peanut Negative    2. Soybean food Negative    3. Wheat, whole Negative    4. Sesame Negative    5. Milk, cow Negative    6. Egg White, chicken Negative    7. Casein Negative    8. Shellfish mix Negative    9. Fish mix Negative    10. Cashew Negative             Intradermal - 01/27/22 1050     Time Antigen Placed 1055    Allergen Manufacturer Greer    Location Arm    Number of Test 15    Control Negative    [place] Negative    Johnson Negative    7 Grass Negative    Ragweed mix Negative    Weed mix Negative    Tree mix Negative  Mold 1 Negative    Mold 2 3+     Mold 3 3+    Mold 4 3+    Cat Negative    Dog Negative    Cockroach Negative    Mite mix 3+             Food Adult Perc - 01/27/22 1000     Time Antigen Placed 1010    Allergen Manufacturer Greer    Location Back    Number of allergen test 8    11. Pecan Food Negative    12. Walnut Food Negative    13. Almond Negative    14. Hazelnut Negative    15. [place] nut Negative    16. Coconut Negative    17. Pistachio Negative    25. Shrimp Negative             Allergy testing results were read and interpreted by myself, documented by clinical staff.  Assessment/Plan  Lashun's allergy testing today was overall negative with the exception of positives on her molds and dust mites during intradermal's.  Since she has not noticed a big difference since stopping her allergy injections, we discussed taking a break for a while and if not controlled with the plan discussed in clinic, may reconsider in the future.  Her egg testing today was negative, borderline at last visit with inadequate controls.  Given her reaction history, we discussed doing an egg challenge in clinic as this is one of her favorite foods.  And I suspect she will do well with the challenge.   Food allergy:  - today's skin testing was negative to all most common food allergens including dairy and eggs  - for now, please strictly avoid UNBAKED DAIRY AND STOVETOP eggs- okay to eat baked dairy and eggs - suspect dairy intolerance and advise ongoing strict avoidance despite negative testing based on your symptoms (asthma exacerbation) - for SKIN + ANY additional symptoms, OR IF concern for LIFE THREATENING reaction = Epipen Autoinjector EpiPen 0.3 mg. - If using Epinephrine autoinjector, call 911 - A food allergy action plan has been provided and discussed. - Medic Alert identification is recommended.  - based on history, let's have you return for an egg challenge (can bring boiled eggs, scrambled eggs,  etc)  Allergic Rhinitis: - labs work for environmental allergens was negative  - skin testing today was negative (borderline to timothy grass only), Intradermals positive to indoor and outdoor molds and dust mites. - Continue Nasal Steroid Spray: Options include Flonase (fluticasone), Nasocort (triamcinolone), Nasonex (mometasome) 1- 2 sprays in each nostril daily (can buy over-the-counter if not covered by insurance)  Best results if used daily. - Continue Astelin (Azelastine) 1-2 sprays in each nostril twice a day as needed.  You may use this as needed for nasal congestion/itchy ears/itchy nose if desired - Continue Clarinex daily or Doterra allergy medication daily.  - Continue singulair 10 mg daily. If nightmares of behavior changes please discontinue immediately. Let's take a break from allergy injections, consider restarting if no results with above.   Allergic Conjunctivitis:  - Continue Allergy Eye drops: great options include Pataday (Olopatadine) or Zaditor (ketotifen) for eye symptoms daily as needed-both sold over the counter if not covered by insurance.   -Avoid eye drops that say red eye relief as they may contain medications that dry out your eyes.  Mild persistent Asthma: - Controller Inhaler: Continue Advair 500 1 puff twice a day; This Should Be Used Everyday -  Rinse mouth out after use - Rescue Inhaler: Albuterol (Proair/Ventolin) 2 puffs . Use  every 4-6 hours as needed for chest tightness, wheezing, or coughing.  Can also use 15 minutes prior to exercise if you have symptoms with activity. - Asthma is not controlled if:  - Symptoms are occurring >2 times a week OR  - >2 times a month nighttime awakenings  - You are requiring systemic steroids (prednisone/steroid injections) more than once per year  - Your require hospitalization for your asthma.  - Please call the clinic to schedule a follow up if these symptoms arise  Follow-up in 4-6 months, sooner if needed. It was  a pleasure seeing you again today!   Tonny Bollman, MD  [facility]

## 2022-01-27 ENCOUNTER — Ambulatory Visit (INDEPENDENT_AMBULATORY_CARE_PROVIDER_SITE_OTHER): Payer: 59 | Admitting: Internal Medicine

## 2022-01-27 ENCOUNTER — Encounter: Payer: Self-pay | Admitting: Internal Medicine

## 2022-01-27 DIAGNOSIS — T781XXA Other adverse food reactions, not elsewhere classified, initial encounter: Secondary | ICD-10-CM

## 2022-01-27 DIAGNOSIS — J3089 Other allergic rhinitis: Secondary | ICD-10-CM

## 2022-01-27 NOTE — Patient Instructions (Addendum)
Food allergy:  - today's skin testing was negative to all most common food allergens including dairy and eggs  - for now, please strictly avoid UNBAKED DAIRY AND STOVETOP eggs- okay to eat baked dairy and eggs - suspect dairy intolerance and advise ongoing strict avoidance despite negative testing based on your symptoms (asthma exacerbation) - for SKIN + ANY additional symptoms, OR IF concern for LIFE THREATENING reaction = Epipen Autoinjector EpiPen 0.3 mg. - If using Epinephrine autoinjector, call 911 - A food allergy action plan has been provided and discussed. - Medic Alert identification is recommended.  - based on history, let's have you return for an egg challenge (can bring boiled eggs, scrambled eggs, etc)  Allergic Rhinitis: - labs work for environmental allergens was negative  - skin testing today was negative (borderline to timothy grass only), Intradermals positive to indoor and outdoor molds and dust mites. - Continue Nasal Steroid Spray: Options include Flonase (fluticasone), Nasocort (triamcinolone), Nasonex (mometasome) 1- 2 sprays in each nostril daily (can buy over-the-counter if not covered by insurance)  Best results if used daily. - Continue Astelin (Azelastine) 1-2 sprays in each nostril twice a day as needed.  You may use this as needed for nasal congestion/itchy ears/itchy nose if desired - Continue Clarinex daily or Doterra allergy medication daily.  - Continue singulair 10 mg daily. If nightmares of behavior changes please discontinue immediately. Let's take a break from allergy injections, consider restarting if no results with above.   Allergic Conjunctivitis:  - Continue Allergy Eye drops: great options include Pataday (Olopatadine) or Zaditor (ketotifen) for eye symptoms daily as needed-both sold over the counter if not covered by insurance.   -Avoid eye drops that say red eye relief as they may contain medications that dry out your eyes.  Mild persistent  Asthma: - Controller Inhaler: Continue Advair 500 1 puff twice a day; This Should Be Used Everyday - Rinse mouth out after use - Rescue Inhaler: Albuterol (Proair/Ventolin) 2 puffs . Use  every 4-6 hours as needed for chest tightness, wheezing, or coughing.  Can also use 15 minutes prior to exercise if you have symptoms with activity. - Asthma is not controlled if:  - Symptoms are occurring >2 times a week OR  - >2 times a month nighttime awakenings  - You are requiring systemic steroids (prednisone/steroid injections) more than once per year  - Your require hospitalization for your asthma.  - Please call the clinic to schedule a follow up if these symptoms arise  Follow-up in 4-6 months, sooner if needed. It was a pleasure seeing you again today! Tonny Bollman, MD [facility]  Control of Mold Allergen   Mold and fungi can grow on a variety of surfaces provided certain temperature and moisture conditions exist.  Outdoor molds grow on plants, decaying vegetation and soil.  The major outdoor mold, Alternaria and Cladosporium, are found in very high numbers during hot and dry conditions.  Generally, a late Summer - Fall peak is seen for common outdoor fungal spores.  Rain will temporarily lower outdoor mold spore count, but counts rise rapidly when the rainy period ends.  The most important indoor molds are Aspergillus and Penicillium.  Dark, humid and poorly ventilated basements are ideal sites for mold growth.  The next most common sites of mold growth are the bathroom and the kitchen.  Outdoor (Seasonal) Mold Control  Use air conditioning and keep windows closed Avoid exposure to decaying vegetation. Avoid leaf raking.  Avoid grain handling. Consider wearing a face mask if working in moldy areas.    Indoor (Perennial) Mold Control   Maintain humidity below 50%. Clean washable surfaces with 5% bleach solution. Remove sources e.g. contaminated carpets.    DUST MITE  AVOIDANCE MEASURES:  There are three main measures that need and can be taken to avoid house dust mites:  Reduce accumulation of dust in general -reduce furniture, clothing, carpeting, books, stuffed animals, especially in bedroom  Separate yourself from the dust -use pillow and mattress encasements (can be found at stores such as [employer] or online) -avoid direct exposure to air condition flow -use a HEPA filter device, especially in the bedroom; you can also use a HEPA filter vacuum cleaner -wipe dust with a moist towel instead of a dry towel or broom when cleaning  Decrease mites and/or their secretions -wash clothing and linen and stuffed animals at highest temperature possible, at least every 2 weeks -stuffed animals can also be placed in a bag and put in a freezer overnight  Despite the above measures, it is impossible to eliminate dust mites or their allergen completely from your home.  With the above measures the burden of mites in your home can be diminished, with the goal of minimizing your allergic symptoms.  Success will be reached only when implementing and using all means together.

## 2022-01-28 ENCOUNTER — Ambulatory Visit (HOSPITAL_BASED_OUTPATIENT_CLINIC_OR_DEPARTMENT_OTHER): Payer: 59 | Admitting: Family Medicine

## 2022-02-02 ENCOUNTER — Ambulatory Visit (HOSPITAL_BASED_OUTPATIENT_CLINIC_OR_DEPARTMENT_OTHER): Payer: 59 | Admitting: Family Medicine

## 2022-02-03 ENCOUNTER — Encounter (HOSPITAL_BASED_OUTPATIENT_CLINIC_OR_DEPARTMENT_OTHER): Payer: Self-pay | Admitting: Family Medicine

## 2022-02-03 ENCOUNTER — Ambulatory Visit (INDEPENDENT_AMBULATORY_CARE_PROVIDER_SITE_OTHER): Payer: Self-pay | Admitting: Family Medicine

## 2022-02-03 ENCOUNTER — Other Ambulatory Visit (HOSPITAL_BASED_OUTPATIENT_CLINIC_OR_DEPARTMENT_OTHER): Payer: Self-pay

## 2022-02-03 DIAGNOSIS — M545 Low back pain, unspecified: Secondary | ICD-10-CM | POA: Insufficient documentation

## 2022-02-03 MED ORDER — MELOXICAM 7.5 MG PO TABS
7.5000 mg | ORAL_TABLET | Freq: Every day | ORAL | 1 refills | Status: DC
  Filled 2022-02-03: qty 30, 30d supply, fill #0
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
## 2022-02-03 NOTE — Patient Instructions (Signed)
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
## 2022-03-03 ENCOUNTER — Ambulatory Visit (HOSPITAL_BASED_OUTPATIENT_CLINIC_OR_DEPARTMENT_OTHER): Payer: 59 | Admitting: Physical Therapy

## 2022-03-15 ENCOUNTER — Ambulatory Visit (HOSPITAL_BASED_OUTPATIENT_CLINIC_OR_DEPARTMENT_OTHER): Payer: 59 | Admitting: Physical Therapy

## 2022-03-17 ENCOUNTER — Ambulatory Visit (HOSPITAL_BASED_OUTPATIENT_CLINIC_OR_DEPARTMENT_OTHER): Payer: 59 | Admitting: Family Medicine

## 2022-03-17 ENCOUNTER — Ambulatory Visit: Payer: 59 | Admitting: Internal Medicine

## 2022-03-30 NOTE — Progress Notes (Deleted)
FOLLOW UP Date of Service/Encounter:  03/30/22   Subjective:  Cindy Dunn (DOB: Nov 02, 1985) is a 36 y.o. female who returns to the [facility] [place] on 04/01/2022 in re-evaluation of the following: *** History obtained from: chart review and {Persons; PED relatives w/patient:19415::"patient"}.  For Review, LV was on 01/27/22  with Dr.Tex Conroy seen for  repeat skin testing as initial visit on 01/20/2022 did not produce adequate controls. .  Based on overall negative skin testing, we opted to take a break from allergy injections which she was previously receiving at an outside allergist.  Her asthma was controlled so we continued Advair 500, 1 puff twice daily.  -------------------- Pertinent History/Diagnostics:  - Asthma: exacerbated by dairy consumption.  Using Advair 500 1 puff twice per day  - Allergic Rhinitis: Started AIT in 2016, stopped at the time due to insurance, restarted 1.5 years ago.  Feels most relief for Doterra essential oil capsule for allergies.                 - igE environmental panel (12/23/21): negative  -Skin testing 01/27/2022- negative, intradermal's positive to indoor and outdoor molds and dust mite - Food Intolerance (dairy-wheezing and asthma flare immediately after consumption; okay with baked dairy, immediate onset hives after eating egg and which                - IgE testing on 12/23/21: negative  - Skin testing 01/27/22: 2 9 most common food allergens including egg white and milk.  Egg challenge was offered ---------------------  Today presents for follow-up. ***  Allergies as of 04/01/2022       Reactions   Milk Protein Shortness Of Breath   ASTHMA FLARES        Medication List        Accurate as of March 30, 2022 10:40 AM. If you have any questions, ask your nurse or doctor.          Advair Diskus 500-50 MCG/ACT Aepb Generic drug: fluticasone-salmeterol Inhale 1 puff into the lungs in the morning and at bedtime.    albuterol 108 (90 Base) MCG/ACT inhaler Commonly known as: VENTOLIN HFA Inhale 2 puffs into the lungs every 4 to 6 hours as needed   azelastine 0.1 % nasal spray Commonly known as: ASTELIN Place 1 spray into each nostril twice a day   cyclobenzaprine 10 MG tablet Commonly known as: FLEXERIL Take 1 tablet (10 mg total) by mouth 2 (two) times daily as needed for muscle spasms.   desloratadine 5 MG tablet Commonly known as: Clarinex Take 1 tablet (5 mg total) by mouth daily.   EPINEPHrine 0.3 mg/0.3 mL Soaj injection Commonly known as: EPI-PEN Inject into the muscle.   fluticasone 50 MCG/ACT nasal spray Commonly known as: FLONASE Place 1 spray into both nostrils in the morning and at bedtime.   meloxicam 7.5 MG tablet Commonly known as: MOBIC Take 1 tablet (7.5 mg total) by mouth daily.   Olopatadine HCl 0.6 % Soln 2 sprays in each nostril       Past Medical History:  Diagnosis Date   Asthma    No past surgical history on file. Otherwise, there have been no changes to her past medical history, surgical history, family history, or social history.  ROS: All others negative except as noted per HPI.   Objective:  There were no vitals taken for this visit. There is no height or weight on file to calculate BMI. Physical Exam: General Appearance:  Alert, cooperative, no distress, appears stated age  Head:  Normocephalic, without obvious abnormality, atraumatic  Eyes:  Conjunctiva clear, EOM's intact  Nose: Nares normal, {Blank multiple:19196:a:"***","hypertrophic turbinates","normal mucosa","no visible anterior polyps","septum midline"}  Throat: Lips, tongue normal; teeth and gums normal, {Blank multiple:19196:a:"***","normal posterior oropharynx","tonsils 2+","tonsils 3+","no tonsillar exudate","+ cobblestoning"}  Neck: Supple, symmetrical  Lungs:   {Blank multiple:19196:a:"***","clear to auscultation bilaterally","end-expiratory wheezing","wheezing throughout"},  Respirations unlabored, {Blank multiple:19196:a:"***","no coughing","intermittent dry coughing"}  Heart:  {Blank multiple:19196:a:"***","regular rate and rhythm","no murmur"}, Appears well perfused  Extremities: No edema  Skin: Skin color, texture, turgor normal, no rashes or lesions on visualized portions of skin  Neurologic: No gross deficits   Reviewed: ***  Spirometry:  Tracings reviewed. Her effort: {Blank single:19197::"Good reproducible efforts.","It was hard to get consistent efforts and there is a question as to whether this reflects a maximal maneuver.","Poor effort, data can not be interpreted.","Variable effort-results affected.","decent for first attempt at spirometry."} FVC: ***L FEV1: ***L, ***% predicted FEV1/FVC ratio: ***% Interpretation: {Blank single:19197::"Spirometry consistent with mild obstructive disease","Spirometry consistent with moderate obstructive disease","Spirometry consistent with severe obstructive disease","Spirometry consistent with possible restrictive disease","Spirometry consistent with mixed obstructive and restrictive disease","Spirometry uninterpretable due to technique","Spirometry consistent with normal pattern","No overt abnormalities noted given today's efforts"}.  Please see scanned spirometry results for details.  Skin Testing: {Blank single:19197::"Select foods","Environmental allergy panel","Environmental allergy panel and select foods","Food allergy panel","None","Deferred due to recent antihistamines use","deferred due to recent reaction"}. ***Adequate positive and negative controls Results discussed with patient/family.   {Blank single:19197::"Allergy testing results were read and interpreted by myself, documented by clinical staff."," "}  Assessment/Plan   ***  Sigurd Sos, MD  [facility] [place] [facility] [place] [facility]

## 2022-03-31 ENCOUNTER — Ambulatory Visit: Payer: 59 | Admitting: Internal Medicine

## 2022-04-01 ENCOUNTER — Ambulatory Visit: Payer: 59 | Admitting: Internal Medicine

## 2022-04-01 DIAGNOSIS — J309 Allergic rhinitis, unspecified: Secondary | ICD-10-CM

## 2022-05-31 ENCOUNTER — Other Ambulatory Visit (HOSPITAL_COMMUNITY): Payer: Self-pay

## 2022-05-31 ENCOUNTER — Encounter: Payer: Self-pay | Admitting: Internal Medicine

## 2022-05-31 ENCOUNTER — Telehealth: Payer: Self-pay

## 2022-05-31 ENCOUNTER — Ambulatory Visit (INDEPENDENT_AMBULATORY_CARE_PROVIDER_SITE_OTHER): Payer: 59 | Admitting: Internal Medicine

## 2022-05-31 DIAGNOSIS — J4531 Mild persistent asthma with (acute) exacerbation: Secondary | ICD-10-CM | POA: Diagnosis not present

## 2022-05-31 DIAGNOSIS — J3089 Other allergic rhinitis: Secondary | ICD-10-CM

## 2022-05-31 DIAGNOSIS — J302 Other seasonal allergic rhinitis: Secondary | ICD-10-CM

## 2022-05-31 DIAGNOSIS — H1013 Acute atopic conjunctivitis, bilateral: Secondary | ICD-10-CM

## 2022-05-31 MED ORDER — PREDNISONE 20 MG PO TABS
40.0000 mg | ORAL_TABLET | Freq: Every day | ORAL | 0 refills | Status: AC
  Filled 2022-05-31: qty 10, 5d supply, fill #0

## 2022-05-31 MED ORDER — IPRATROPIUM BROMIDE 0.03 % NA SOLN
2.0000 | Freq: Three times a day (TID) | NASAL | 3 refills | Status: AC | PRN
  Filled 2022-05-31: qty 30, 29d supply, fill #0

## 2022-05-31 NOTE — Telephone Encounter (Signed)
I have a 1:30 opening today if she can do come in or do a call or can do Wednesday afternoon in person or call at 3, 3:15, 3:30 for sick visit.

## 2022-05-31 NOTE — Telephone Encounter (Signed)
Called patient - advised provider could do televisit @ 3 pm today. Patient stated she would available to do televisit.  Schedule televisist for [facility] w/Dr.Dennis @ 3 pm.

## 2022-05-31 NOTE — Progress Notes (Signed)
RE: ANASTASHA ORTEZ MRN: [id number] DOB: 21-Feb-1986 Date of Telemedicine Visit: 05/31/2022  Referring provider: No ref. provider found Primary care provider: No primary care provider on file.  Chief Complaint: Allergies (Cleaning a house Thursday, taking allergy pills but still coughing at night. Using breathing treatment every morning. Getting relief in the day time but at night can barely sleep. )   Telemedicine Follow Up Visit via Telephone: I connected with Shatasha Lambing for a follow up on 05/31/22 by telephone and verified that I am speaking with the correct person using two identifiers.   I discussed the limitations, risks, security and privacy concerns of performing an evaluation and management service by telephone and the availability of in person appointments. I also discussed with the patient that there may be a patient responsible charge related to this service. The patient expressed understanding and agreed to proceed.  Patient is driving in her car accompanied by her mother who provided/contributed to the history.  Provider is at the office.  Visit start time: 2:53 PM Visit end time: 3:08 PM Insurance consent/check in by: Lincoln Brigham Medical consent and medical assistant/nurse: Florence Canner, CMA  History of Present Illness: She is a 36 y.o. female, who is being followed for asthma, allergic rhinitis, food allergy. Her previous allergy office visit was on 02/02/22 with  Tonny Bollman, MD .   Today is an acute visit for congestion. Last Thursday she was cleaning her office, vacuuming, cleaning dust and the following day woke-up with acute allergic/asthma flare.  She believes dust has triggered her allergies.   She is having increased cough, can't take good breaths. She is doing her breathing treatments with albuterol every 4-6 hours since Friday morning. This is providing some relief, but flares worse at night. She is using her Advair 1 puff twice per day. Her nose if  running like crazy and she is sneezing. She does not use atrovent nasal spray. She is using her other medications as prescribed. She denies fevers or other sick symptoms.  Pertinent History/Diagnostics:  - Asthma: exacerbated by dairy consumption.  Using Advair 500 1 puff twice per day  - Allergic Rhinitis: Started AIT in 2016, stopped at the time due to insurance, restarted 1.5 years ago.  Feels most relief for Doterra essential oil capsule for allergies.                 - igE environmental panel (12/23/21): negative  - SPT 02/02/22: borderline timothy grass: IDs + mold, dust mites - Food Intolerance (dairy-wheezing and asthma flare immediately after consumption; okay with baked dairy                - IgE testing on 12/23/21: negative  Assessment and Plan: Avenell is a 36 y.o. female with:  Mild persistent Asthma with acute exacerbation During acute flare-start prednisone 40 mg daily for 4 days then 20 mg on day 5. Continue albuterol 2 puffs every 4-6 hours as needed, can you schedule during the next few days If symptoms worsen, please seek medical attention   - Controller Inhaler: Continue Advair 500 1 puff twice a day; This Should Be Used Everyday - Rinse mouth out after use - Rescue Inhaler: Albuterol (Proair/Ventolin) 2 puffs . Use  every 4-6 hours as needed for chest tightness, wheezing, or coughing.  Can also use 15 minutes prior to exercise if you have symptoms with activity. - Asthma is not controlled if:  - Symptoms are occurring >2 times a week OR  - >  2 times a month nighttime awakenings  - You are requiring systemic steroids (prednisone/steroid injections) more than once per year  - Your require hospitalization for your asthma.  - Please call the clinic to schedule a follow up if these symptoms arise  Allergic Rhinitis: - overall negative; completed AIT in 2022 after 5 years. - Continue Flonase (fluticasone) 1- 2 sprays in each nostril daily (can buy over-the-counter if not  covered by insurance)  Best results if used daily. - Continue Astelin (Azelastine) 1-2 sprays in each nostril twice a day as needed.  You may use this as needed for nasal congestion/itchy ears/itchy nose if desired - Continue Clarinex daily or Doterra allergy medication daily.  - Continue singulair 10 mg daily. If nightmares of behavior changes please discontinue immediately. - Start atrovent nasal spray 1-2 sprays each nostril, up to three times daily  Allergic Conjunctivitis:  - Continue Allergy Eye drops: great options include Pataday (Olopatadine) or Zaditor (ketotifen) for eye symptoms daily as needed-both sold over the counter if not covered by insurance.   -Avoid eye drops that say red eye relief as they may contain medications that dry out your eyes.  Follow-up in 6 months, sooner if needed.   Meds ordered this encounter  Medications   predniSONE (DELTASONE) 20 MG tablet    Sig: Take 2 tablets (40 mg total) by mouth daily with breakfast for 5 days.    Dispense:  10 tablet    Refill:  0   ipratropium (ATROVENT) 0.03 % nasal spray    Sig: Place 2 sprays into both nostrils 3 (three) times daily as needed for rhinitis.    Dispense:  30 mL    Refill:  3   Lab Orders  No laboratory test(s) ordered today    Diagnostics: None.  Medication List:  Current Outpatient Medications  Medication Sig Dispense Refill   albuterol (VENTOLIN HFA) 108 (90 Base) MCG/ACT inhaler Inhale 2 puffs into the lungs every 4 to 6 hours as needed 18 g 0   azelastine (ASTELIN) 0.1 % nasal spray Place 1 spray into each nostril twice a day 30 mL 5   cyclobenzaprine (FLEXERIL) 10 MG tablet Take 1 tablet (10 mg total) by mouth 2 (two) times daily as needed for muscle spasms. 20 tablet 0   desloratadine (CLARINEX) 5 MG tablet Take 1 tablet (5 mg total) by mouth daily. 90 tablet 3   EPINEPHrine 0.3 mg/0.3 mL IJ SOAJ injection Inject into the muscle.     fluticasone (FLONASE) 50 MCG/ACT nasal spray Place 1  spray into both nostrils in the morning and at bedtime. 16 g 6   fluticasone-salmeterol (ADVAIR DISKUS) 500-50 MCG/ACT AEPB Inhale 1 puff into the lungs in the morning and at bedtime. 60 each 6   ipratropium (ATROVENT) 0.03 % nasal spray Place 2 sprays into both nostrils 3 (three) times daily as needed for rhinitis. 30 mL 3   Olopatadine HCl 0.6 % SOLN 2 sprays in each nostril     predniSONE (DELTASONE) 20 MG tablet Take 2 tablets (40 mg total) by mouth daily with breakfast for 5 days. 10 tablet 0   No current facility-administered medications for this visit.   Allergies: Allergies  Allergen Reactions   Milk Protein Shortness Of Breath    ASTHMA FLARES   I reviewed her past medical history, social history, family history, and environmental history and no significant changes have been reported from previous visit.  Review of Systems-negative except as per HPI Objective: Physical  Exam Not obtained as encounter was done via telephone.   Previous notes and tests were reviewed.  I discussed the assessment and treatment plan with the patient. The patient was provided an opportunity to ask questions and all were answered. The patient agreed with the plan and demonstrated an understanding of the instructions.   The patient was advised to call back or seek an in-person evaluation if the symptoms worsen or if the condition fails to improve as anticipated.  I provided 10 minutes of non-face-to-face time during this encounter.  It was my pleasure to participate in Nozomi Mettler care today. Please feel free to contact me with any questions or concerns.   Sincerely,  Verlee Monte, MD

## 2022-05-31 NOTE — Patient Instructions (Addendum)
Mild persistent Asthma with acute exacerbation During acute flare-start prednisone 40 mg daily for 4 days then 20 mg on day 5. Continue albuterol 2 puffs every 4-6 hours as needed, can you schedule during the next few days   - Controller Inhaler: Continue Advair 500 1 puff twice a day; This Should Be Used Everyday - Rinse mouth out after use - Rescue Inhaler: Albuterol (Proair/Ventolin) 2 puffs . Use  every 4-6 hours as needed for chest tightness, wheezing, or coughing.  Can also use 15 minutes prior to exercise if you have symptoms with activity. - Asthma is not controlled if:  - Symptoms are occurring >2 times a week OR  - >2 times a month nighttime awakenings  - You are requiring systemic steroids (prednisone/steroid injections) more than once per year  - Your require hospitalization for your asthma.  - Please call the clinic to schedule a follow up if these symptoms arise  Allergic Rhinitis: - overall negative; completed AIT in 2022 after 5 years. - Continue Flonase (fluticasone) 1- 2 sprays in each nostril daily (can buy over-the-counter if not covered by insurance)  Best results if used daily. - Continue Astelin (Azelastine) 1-2 sprays in each nostril twice a day as needed.  You may use this as needed for nasal congestion/itchy ears/itchy nose if desired - Continue Clarinex daily or Doterra allergy medication daily.  - Continue singulair 10 mg daily. If nightmares of behavior changes please discontinue immediately. - Start atrovent nasal spray 1-2 sprays each nostril, up to three times daily  Allergic Conjunctivitis:  - Continue Allergy Eye drops: great options include Pataday (Olopatadine) or Zaditor (ketotifen) for eye symptoms daily as needed-both sold over the counter if not covered by insurance.   -Avoid eye drops that say red eye relief as they may contain medications that dry out your eyes.  Follow-up in 6 months, sooner if needed. It was a pleasure talking to you again  today. Tonny Bollman, MD [facility]  Control of Mold Allergen   Mold and fungi can grow on a variety of surfaces provided certain temperature and moisture conditions exist.  Outdoor molds grow on plants, decaying vegetation and soil.  The major outdoor mold, Alternaria and Cladosporium, are found in very high numbers during hot and dry conditions.  Generally, a late Summer - Fall peak is seen for common outdoor fungal spores.  Rain will temporarily lower outdoor mold spore count, but counts rise rapidly when the rainy period ends.  The most important indoor molds are Aspergillus and Penicillium.  Dark, humid and poorly ventilated basements are ideal sites for mold growth.  The next most common sites of mold growth are the bathroom and the kitchen.  Outdoor (Seasonal) Mold Control  Use air conditioning and keep windows closed Avoid exposure to decaying vegetation. Avoid leaf raking. Avoid grain handling. Consider wearing a face mask if working in moldy areas.    Indoor (Perennial) Mold Control   Maintain humidity below 50%. Clean washable surfaces with 5% bleach solution. Remove sources e.g. contaminated carpets.    DUST MITE AVOIDANCE MEASURES:  There are three main measures that need and can be taken to avoid house dust mites:  Reduce accumulation of dust in general -reduce furniture, clothing, carpeting, books, stuffed animals, especially in bedroom  Separate yourself from the dust -use pillow and mattress encasements (can be found at stores such as [employer] or online) -avoid direct exposure to air condition flow -use a  HEPA filter device, especially in the bedroom; you can also use a HEPA filter vacuum cleaner -wipe dust with a moist towel instead of a dry towel or broom when cleaning  Decrease mites and/or their secretions -wash clothing and linen and stuffed animals at highest temperature possible, at least every 2 weeks -stuffed animals  can also be placed in a bag and put in a freezer overnight  Despite the above measures, it is impossible to eliminate dust mites or their allergen completely from your home.  With the above measures the burden of mites in your home can be diminished, with the goal of minimizing your allergic symptoms.  Success will be reached only when implementing and using all means togethe

## 2022-05-31 NOTE — Telephone Encounter (Signed)
Patient called in - DOB verified - stated the following:  Symptoms: Cough - dry; runny nose - clear; some shortness of breath that started on Friday. Patient stated she has been using medications as directed as is trying to get asthma under control.  Patient advised of appointment availability in [place] at 3 pm - patient stated she would not be able to go to that appt do to going out of town.   Patient advised would forward message to provider for next step but if her shortness of breath gets worse please go straight to the nearest UR/ER for further evaluation and treatment.  Patient verbalized understanding, no further questions.

## 2022-06-08 NOTE — Progress Notes (Signed)
FOLLOW UP Date of Service/Encounter:  06/09/22   Subjective:  Cindy Dunn (DOB: 19-Feb-1986) is a 36 y.o. female who [facility] in re-evaluation of the following: asthma, allergic rhinitis, food allergy  History obtained from: chart review and patient.  For Review, LV was on 05/31/22  with Dr.Angie Hogg seen for  tele health visit for asthma exacerbation-required course of prendisone x 5 days .  Today presents for follow-up. Since her last visit, her breathing has improved since taking the prednisone Dosepak.  Her last dose was on Friday.  However, on Thursday she had an asthma flare at work consisting of chronic coughing which lasted for around 15 minutes.  Since then, she has been using her rescue inhaler twice per day which has been helpful. She is not sure what flared her second exacerbation.  She does not feel that she is at a point where she needs more steroids, but is coughing intermittently throughout the day-still improved from previous flare.  Her prior flare was secondary to cleaning and significant dust exposure.  She does not feel like she has a cold.  No fevers. Prior to these 2 exacerbations, she reports that she has still been using her rescue inhaler around once per day when at work because she does a lot of running around.  It is helpful for her.  She is using her Advair 501 puff twice a day as instructed. Dairy has been a known trigger for her in the past, but she is not currently eating any dairy products.  ---------------------------------- Pertinent History/Diagnostics:  Asthma:  exacerbated by dairy consumption.  Using Advair 500 1 puff twice per day AEC 01/04/22: 800, total IgE 14 (12/23/21) Allergic Rhinitis:  Started AIT in 2016, stopped at the time due to insurance, restarted 1.5 years ago.  Feels most relief for Doterra essential oil capsule for allergies.   - igE environmental panel (12/23/21): negative  - SPT  02/02/22: borderline timothy grass: IDs + mold, dust mites Food Intolerance (dairy-wheezing and asthma flare immediately after consumption; okay with baked dairy   - IgE testing on 12/23/21: negative  Allergies as of 06/09/2022       Reactions   Milk Protein Shortness Of Breath   ASTHMA FLARES        Medication List        Accurate as of June 09, 2022 12:41 PM. If you have any questions, ask your nurse or doctor.          STOP taking these medications    Advair Diskus 500-50 MCG/ACT Aepb Generic drug: fluticasone-salmeterol Stopped by: Verlee Monte, MD   albuterol 108 (90 Base) MCG/ACT inhaler Commonly known as: VENTOLIN HFA Stopped by: Verlee Monte, MD       TAKE these medications    Airsupra 90-80 MCG/ACT Aero Generic drug: Albuterol-Budesonide Inhale 2 puffs into the lungs as needed (maximum 12 puffs/day). Started by: Verlee Monte, MD   azelastine 0.1 % nasal spray Commonly known as: ASTELIN Place 1 spray into each nostril twice a day   Breztri Aerosphere 160-9-4.8 MCG/ACT Aero Generic drug: Budeson-Glycopyrrol-Formoterol Inhale 2 puffs into the lungs in the morning and at bedtime. Started by: Verlee Monte, MD   cyclobenzaprine 10 MG tablet Commonly known as: FLEXERIL Take 1 tablet (10 mg total) by mouth 2 (two) times daily as needed for muscle spasms.   desloratadine 5 MG tablet Commonly known as: Clarinex Take 1 tablet (5 mg  total) by mouth daily.   EPINEPHrine 0.3 mg/0.3 mL Soaj injection Commonly known as: EPI-PEN Inject into the muscle.   fluticasone 50 MCG/ACT nasal spray Commonly known as: FLONASE Place 1 spray into both nostrils in the morning and at bedtime.   ipratropium 0.03 % nasal spray Commonly known as: ATROVENT Place 2 sprays into both nostrils 3 (three) times daily as needed for rhinitis.   Olopatadine HCl 0.6 % Soln 2 sprays in each nostril       Past Medical History:  Diagnosis Date   Asthma    No past  surgical history on file. Otherwise, there have been no changes to her past medical history, surgical history, family history, or social history.  ROS: All others negative except as noted per HPI.   Objective:  BP 100/60   Pulse 78   Temp 98.4 F (36.9 C) (Temporal)   Resp 16   Ht 5' 2.4" (1.585 m)   Wt 140 lb 1.6 oz (63.5 kg)   SpO2 98%   BMI 25.30 kg/m  Body mass index is 25.3 kg/m. Physical Exam: General Appearance:  Alert, cooperative, no distress, appears stated age  Head:  Normocephalic, without obvious abnormality, atraumatic  Eyes:  Conjunctiva clear, EOM's intact  Nose: Nares normal, hypertrophic turbinates and normal mucosa  Throat: Lips, tongue normal; teeth and gums normal, normal posterior oropharynx  Neck: Supple, symmetrical  Lungs:   clear to auscultation bilaterally, Respirations unlabored, intermittent dry coughing  Heart:  regular rate and rhythm and no murmur, Appears well perfused  Extremities: No edema  Skin: Skin color, texture, turgor normal, no rashes or lesions on visualized portions of skin  Neurologic: No gross deficits   Spirometry:  Tracings reviewed. Her effort: Good reproducible efforts. FVC: 2.51L FEV1: 2.36L, 81% predicted FEV1/FVC ratio: 113% Interpretation: Spirometry consistent with possible restrictive disease.  Please see scanned spirometry results for details.   Assessment/Plan   Mild persistent Asthma - not controlled - Controller Inhaler: Start Breztri 2 puff twice a day; This Should Be Used Everyday with a spacer and mask - Rinse mouth out after use - Rescue Inhaler: Airsupra - use 2-4 puffs as needed, maximum 12 puffs/day.    Can also use 15 minutes prior to exercise if you have symptoms with activity. - Asthma is not controlled if:  - Symptoms are occurring >2 times a week OR  - >2 times a month nighttime awakenings  - You are requiring systemic steroids (prednisone/steroid injections) more than once per year  - Your  require hospitalization for your asthma.  - Please call the clinic to schedule a follow up if these symptoms arise -sample of fasenra, injectable asthma medication given in clinic. We will submit to our biologics coordinate Tammy Voncannon who will be in touch regarding approval process  Allergic Rhinitis: controlled - overall negative; completed AIT in 2022 after 5 years. - Continue Flonase (fluticasone) 1- 2 sprays in each nostril daily (can buy over-the-counter if not covered by insurance)  Best results if used daily. - Continue Astelin (Azelastine) 1-2 sprays in each nostril twice a day as needed.  You may use this as needed for nasal congestion/itchy ears/itchy nose if desired - Continue Clarinex daily or Doterra allergy medication daily.  - Continue singulair 10 mg daily. If nightmares of behavior changes please discontinue immediately. -  atrovent nasal spray 1-2 sprays each nostril, up to three times daily  Allergic Conjunctivitis: controlled - Continue Allergy Eye drops: great options include Pataday (Olopatadine) or  Zaditor (ketotifen) for eye symptoms daily as needed-both sold over the counter if not covered by insurance.   -Avoid eye drops that say red eye relief as they may contain medications that dry out your eyes.  Follow-up in 3 months, sooner if needed. It was a pleasure seeing you again today.  Tonny Bollman, MD  [facility]

## 2022-06-09 ENCOUNTER — Other Ambulatory Visit: Payer: Self-pay

## 2022-06-09 ENCOUNTER — Ambulatory Visit: Payer: 59 | Admitting: Internal Medicine

## 2022-06-09 ENCOUNTER — Other Ambulatory Visit (HOSPITAL_COMMUNITY): Payer: Self-pay

## 2022-06-09 ENCOUNTER — Encounter: Payer: Self-pay | Admitting: Internal Medicine

## 2022-06-09 VITALS — BP 100/60 | HR 78 | Temp 98.4°F | Resp 16 | Ht 62.4 in | Wt 140.1 lb

## 2022-06-09 DIAGNOSIS — J302 Other seasonal allergic rhinitis: Secondary | ICD-10-CM

## 2022-06-09 DIAGNOSIS — J4550 Severe persistent asthma, uncomplicated: Secondary | ICD-10-CM | POA: Diagnosis not present

## 2022-06-09 DIAGNOSIS — J4551 Severe persistent asthma with (acute) exacerbation: Secondary | ICD-10-CM

## 2022-06-09 DIAGNOSIS — J3089 Other allergic rhinitis: Secondary | ICD-10-CM

## 2022-06-09 DIAGNOSIS — H1013 Acute atopic conjunctivitis, bilateral: Secondary | ICD-10-CM | POA: Diagnosis not present

## 2022-06-09 DIAGNOSIS — T781XXD Other adverse food reactions, not elsewhere classified, subsequent encounter: Secondary | ICD-10-CM

## 2022-06-09 MED ORDER — BREZTRI AEROSPHERE 160-9-4.8 MCG/ACT IN AERO
2.0000 | INHALATION_SPRAY | Freq: Two times a day (BID) | RESPIRATORY_TRACT | 3 refills | Status: DC
  Filled 2022-06-09: qty 10.7, 30d supply, fill #0

## 2022-06-09 MED ORDER — AIRSUPRA 90-80 MCG/ACT IN AERO
2.0000 | INHALATION_SPRAY | RESPIRATORY_TRACT | 3 refills | Status: DC | PRN
  Filled 2022-06-09: qty 10.7, 10d supply, fill #0

## 2022-06-09 MED ORDER — BENRALIZUMAB 30 MG/ML ~~LOC~~ SOSY
30.0000 mg | PREFILLED_SYRINGE | SUBCUTANEOUS | Status: AC
  Administered 2022-06-09: 30 mg via SUBCUTANEOUS

## 2022-06-09 NOTE — Patient Instructions (Addendum)
Mild persistent Asthma  - Controller Inhaler: Start Breztri 2 puff twice a day; This Should Be Used Everyday with a spacer and mask - Rinse mouth out after use - Rescue Inhaler: Airsupra - use 2-4 puffs as needed, maximum 12 puffs/day.    Can also use 15 minutes prior to exercise if you have symptoms with activity. - Asthma is not controlled if:  - Symptoms are occurring >2 times a week OR  - >2 times a month nighttime awakenings  - You are requiring systemic steroids (prednisone/steroid injections) more than once per year  - Your require hospitalization for your asthma.  - Please call the clinic to schedule a follow up if these symptoms arise -sample of fasenra, injectable asthma medication given in clinic. We will submit to our biologics coordinate Tammy Voncannon who will be in touch regarding approval process  Allergic Rhinitis: - overall negative; completed AIT in 2022 after 5 years. - Continue Flonase (fluticasone) 1- 2 sprays in each nostril daily (can buy over-the-counter if not covered by insurance)  Best results if used daily. - Continue Astelin (Azelastine) 1-2 sprays in each nostril twice a day as needed.  You may use this as needed for nasal congestion/itchy ears/itchy nose if desired - Continue Clarinex daily or Doterra allergy medication daily.  - Continue singulair 10 mg daily. If nightmares of behavior changes please discontinue immediately. -  atrovent nasal spray 1-2 sprays each nostril, up to three times daily  Allergic Conjunctivitis:  - Continue Allergy Eye drops: great options include Pataday (Olopatadine) or Zaditor (ketotifen) for eye symptoms daily as needed-both sold over the counter if not covered by insurance.   -Avoid eye drops that say red eye relief as they may contain medications that dry out your eyes.  Follow-up in 3 months, sooner if needed. It was a pleasure seeing you again today.  Tonny Bollman, MD [facility]  Control of Mold  Allergen   Mold and fungi can grow on a variety of surfaces provided certain temperature and moisture conditions exist.  Outdoor molds grow on plants, decaying vegetation and soil.  The major outdoor mold, Alternaria and Cladosporium, are found in very high numbers during hot and dry conditions.  Generally, a late Summer - Fall peak is seen for common outdoor fungal spores.  Rain will temporarily lower outdoor mold spore count, but counts rise rapidly when the rainy period ends.  The most important indoor molds are Aspergillus and Penicillium.  Dark, humid and poorly ventilated basements are ideal sites for mold growth.  The next most common sites of mold growth are the bathroom and the kitchen.  Outdoor (Seasonal) Mold Control  Use air conditioning and keep windows closed Avoid exposure to decaying vegetation. Avoid leaf raking. Avoid grain handling. Consider wearing a face mask if working in moldy areas.    Indoor (Perennial) Mold Control   Maintain humidity below 50%. Clean washable surfaces with 5% bleach solution. Remove sources e.g. contaminated carpets.    DUST MITE AVOIDANCE MEASURES:  There are three main measures that need and can be taken to avoid house dust mites:  Reduce accumulation of dust in general -reduce furniture, clothing, carpeting, books, stuffed animals, especially in bedroom  Separate yourself from the dust -use pillow and mattress encasements (can be found at stores such as [employer] or online) -avoid direct exposure to air condition flow -use a HEPA filter device, especially in the bedroom; you can also use a HEPA  filter vacuum cleaner -wipe dust with a moist towel instead of a dry towel or broom when cleaning  Decrease mites and/or their secretions -wash clothing and linen and stuffed animals at highest temperature possible, at least every 2 weeks -stuffed animals can also be placed in a bag and put in a freezer overnight  Despite the  above measures, it is impossible to eliminate dust mites or their allergen completely from your home.  With the above measures the burden of mites in your home can be diminished, with the goal of minimizing your allergic symptoms.  Success will be reached only when implementing and using all means togethe

## 2022-06-09 NOTE — Progress Notes (Signed)
Immunotherapy   Patient Details  Name: Cindy Dunn MRN: [id number] Date of Birth: February 26, 1986  06/09/2022  Cindy Dunn received a sample of Fasenra 30 mg for asthma. Patient waited 30 minutes with no problems.   Frequency: q4wks times 3 doses then q8wks Consent signed and patient instructions given.   Dub Mikes 06/09/2022, 12:29 PM

## 2022-06-10 ENCOUNTER — Other Ambulatory Visit (HOSPITAL_COMMUNITY): Payer: Self-pay

## 2022-06-10 ENCOUNTER — Encounter: Payer: Self-pay | Admitting: Internal Medicine

## 2022-06-11 ENCOUNTER — Other Ambulatory Visit (HOSPITAL_COMMUNITY): Payer: Self-pay

## 2022-06-14 ENCOUNTER — Telehealth: Payer: Self-pay | Admitting: *Deleted

## 2022-06-14 ENCOUNTER — Other Ambulatory Visit: Payer: Self-pay | Admitting: Pharmacist

## 2022-06-14 ENCOUNTER — Ambulatory Visit: Payer: 59 | Attending: Internal Medicine | Admitting: Pharmacist

## 2022-06-14 ENCOUNTER — Other Ambulatory Visit (HOSPITAL_COMMUNITY): Payer: Self-pay

## 2022-06-14 ENCOUNTER — Telehealth: Payer: Self-pay | Admitting: Pharmacist

## 2022-06-14 DIAGNOSIS — Z7189 Other specified counseling: Secondary | ICD-10-CM

## 2022-06-14 MED ORDER — FASENRA PEN 30 MG/ML ~~LOC~~ SOAJ
30.0000 mg | SUBCUTANEOUS | 8 refills | Status: DC
  Filled 2022-06-14: qty 1, 28d supply, fill #0

## 2022-06-14 MED ORDER — FASENRA PEN 30 MG/ML ~~LOC~~ SOAJ
30.0000 mg | SUBCUTANEOUS | 8 refills | Status: AC
  Filled 2022-06-14 – 2022-06-29 (×2): qty 1, 28d supply, fill #0
  Filled 2022-07-20: qty 1, 28d supply, fill #1
  Filled 2022-08-26 – 2022-09-09 (×3): qty 1, 28d supply, fill #2
  Filled 2022-09-28: qty 1, 56d supply, fill #2

## 2022-06-14 NOTE — Telephone Encounter (Signed)
Called patient and advised approval, copay card and submit to [facility] for [place] pen. Reviewed delivery, storage and dosing for same

## 2022-06-14 NOTE — Telephone Encounter (Signed)
-----   Message from Verlee Monte, MD sent at 06/09/2022 12:29 PM EST ----- Hi Cindy Dunn - can we see about getting her on fasenra for asthma? I gave her a sample today in clinic. AEC 800 in 12/2021. Thanks.

## 2022-06-14 NOTE — Progress Notes (Signed)
   S: Patient presents today for review of their specialty medication.   Patient is currently taking Fasenra for asthma. Patient is managed by Dr. Maurine Minister for this.   Dosing: 30 mg subq once every 28 days for two doses. Then, once every 8 weeks thereafter.   Adherence: confirms. First injection was in clinic last week.   Efficacy: has not been on long enough.   Monitoring:  Anaphylaxis/hypersensitivity: denies  PFTs: monitored by her specialist  Current adverse effects: S/sx of infection: none HA: denies Pharyngitis: denies  Fever: denies   O:     Lab Results  Component Value Date   WBC 9.4 01/04/2022   HGB 12.2 01/04/2022   HCT 37.6 01/04/2022   MCV 90.4 01/04/2022   PLT 266 01/04/2022      Chemistry      Component Value Date/Time   NA 140 01/04/2022 1431   NA 142 12/10/2021 0858   K 3.7 01/04/2022 1431   CL 104 01/04/2022 1431   CO2 25 01/04/2022 1431   BUN 9 01/04/2022 1431   BUN 9 12/10/2021 0858   CREATININE 0.73 01/04/2022 1431      Component Value Date/Time   CALCIUM 9.4 01/04/2022 1431   ALKPHOS 80 12/10/2021 0858   AST 15 12/10/2021 0858   ALT 12 12/10/2021 0858   BILITOT 0.4 12/10/2021 0858       A/P: 1. Medication review: patient currently on Fasenra for asthma. She tolerated her first dose well. Medication was reviewed with the patient including the following: benralizumab is a MAB that binds to the IL-5 receptor. Benralizumab reduces the production and survival of eosinophils amd basophils through antibody dependent cell-mediated cytotoxicity. Common adverse effects include antibody development, HA, pharyngitis, and hypersensitivity. Fever is less common but can occur. Administrations - SubQ: Prior to administration, allow syringe/autoinjector to warm at room temperature for ~30 minutes. Administer SubQ into the upper arm, thigh, or abdomen. Prefilled syringe should only be administered by a health care provider; autoinjector may be administered by  patient or caregiver after proper training. Refer to the manufacturer's labeling for additional administration instructions.   Butch Penny, PharmD, P[place]y [place], CPP Clinical Pharmacist [facility] [phone]

## 2022-06-14 NOTE — Telephone Encounter (Signed)
Called patient to schedule an appointment for the [employer] [facility]. I was unable to reach the patient so I left a HIPAA-compliant message requesting that the patient return my call.   Luke Van Ausdall, PharmD, BCACP, CPP Clinical Pharmacist [facility] [phone]  

## 2022-06-16 ENCOUNTER — Other Ambulatory Visit (HOSPITAL_COMMUNITY): Payer: Self-pay

## 2022-06-22 ENCOUNTER — Other Ambulatory Visit (HOSPITAL_COMMUNITY): Payer: Self-pay

## 2022-06-24 ENCOUNTER — Other Ambulatory Visit (HOSPITAL_COMMUNITY): Payer: Self-pay

## 2022-06-25 ENCOUNTER — Other Ambulatory Visit (HOSPITAL_COMMUNITY): Payer: Self-pay

## 2022-06-29 ENCOUNTER — Other Ambulatory Visit: Payer: Self-pay

## 2022-06-29 ENCOUNTER — Other Ambulatory Visit (HOSPITAL_COMMUNITY): Payer: Self-pay

## 2022-06-30 ENCOUNTER — Other Ambulatory Visit: Payer: Self-pay

## 2022-06-30 ENCOUNTER — Ambulatory Visit: Payer: 59 | Admitting: Internal Medicine

## 2022-06-30 ENCOUNTER — Other Ambulatory Visit (HOSPITAL_COMMUNITY): Payer: Self-pay

## 2022-07-01 ENCOUNTER — Other Ambulatory Visit (HOSPITAL_COMMUNITY): Payer: Self-pay

## 2022-07-06 ENCOUNTER — Other Ambulatory Visit (HOSPITAL_COMMUNITY): Payer: Self-pay

## 2022-07-20 ENCOUNTER — Other Ambulatory Visit (HOSPITAL_COMMUNITY): Payer: Self-pay

## 2022-07-21 ENCOUNTER — Other Ambulatory Visit (HOSPITAL_COMMUNITY): Payer: Self-pay

## 2022-07-28 ENCOUNTER — Other Ambulatory Visit (HOSPITAL_COMMUNITY): Payer: Self-pay

## 2022-08-19 ENCOUNTER — Other Ambulatory Visit (HOSPITAL_COMMUNITY): Payer: Self-pay

## 2022-08-27 ENCOUNTER — Encounter: Payer: Self-pay | Admitting: Internal Medicine

## 2022-08-27 ENCOUNTER — Other Ambulatory Visit (HOSPITAL_COMMUNITY): Payer: Self-pay

## 2022-09-09 ENCOUNTER — Other Ambulatory Visit (HOSPITAL_COMMUNITY): Payer: Self-pay

## 2022-09-09 ENCOUNTER — Telehealth: Payer: Self-pay | Admitting: *Deleted

## 2022-09-09 NOTE — Telephone Encounter (Signed)
L/m for patient to contact clinic for MD appt for Fasenra reapproval ?

## 2022-09-10 ENCOUNTER — Ambulatory Visit: Payer: Commercial Managed Care - PPO | Admitting: Family Medicine

## 2022-09-10 ENCOUNTER — Other Ambulatory Visit (HOSPITAL_COMMUNITY): Payer: Self-pay

## 2022-09-10 ENCOUNTER — Encounter: Payer: Self-pay | Admitting: Family Medicine

## 2022-09-10 ENCOUNTER — Other Ambulatory Visit: Payer: Self-pay

## 2022-09-10 VITALS — BP 112/74 | HR 74 | Temp 98.3°F | Resp 18 | Ht 62.25 in | Wt 142.7 lb

## 2022-09-10 DIAGNOSIS — R442 Other hallucinations: Secondary | ICD-10-CM

## 2022-09-10 DIAGNOSIS — J3089 Other allergic rhinitis: Secondary | ICD-10-CM

## 2022-09-10 DIAGNOSIS — T781XXD Other adverse food reactions, not elsewhere classified, subsequent encounter: Secondary | ICD-10-CM

## 2022-09-10 DIAGNOSIS — J302 Other seasonal allergic rhinitis: Secondary | ICD-10-CM

## 2022-09-10 DIAGNOSIS — H1013 Acute atopic conjunctivitis, bilateral: Secondary | ICD-10-CM

## 2022-09-10 DIAGNOSIS — K219 Gastro-esophageal reflux disease without esophagitis: Secondary | ICD-10-CM | POA: Diagnosis not present

## 2022-09-10 DIAGNOSIS — J4550 Severe persistent asthma, uncomplicated: Secondary | ICD-10-CM | POA: Diagnosis not present

## 2022-09-10 DIAGNOSIS — T781XXA Other adverse food reactions, not elsewhere classified, initial encounter: Secondary | ICD-10-CM | POA: Insufficient documentation

## 2022-09-10 MED ORDER — FAMOTIDINE 20 MG PO TABS
20.0000 mg | ORAL_TABLET | Freq: Two times a day (BID) | ORAL | 0 refills | Status: DC
  Filled 2022-09-10: qty 60, 30d supply, fill #0

## 2022-09-10 NOTE — Progress Notes (Signed)
[place] [address] Dept: [phone]  FOLLOW UP NOTE  Patient ID: Cindy Dunn, female    DOB: [birth year]  Age: 37 y.o. MRN: [id number] Date of Office Visit: 09/10/2022  Assessment  Chief Complaint: Asthma  HPI Cindy Dunn is a 37 year old female who presents to the clinic for follow-up visit.  She was last seen in this clinic on 06/09/2022 by Dr. Simona Huh for evaluation of asthma, allergic rhinitis, allergic conjunctivitis, and food allergy.  At today's visit, she reports her asthma has been poorly controlled with shortness of breath occurring with activity and chest tightness when exhaling.  She reports these are ongoing symptoms that rarely resolve.  She continues montelukast 10 mg once a day, Breztri 2 puffs twice a day with a spacer and is using air supra infrequently.  She continues Fasenra injections and has had a total of 3 injections so far.  She has been self administering injections and reports no large or local reactions.  She reports a mild decrease in her symptoms of asthma while continuing on Fasenra injections.  She continues to follow-up with Dr. Silas Flood, pulmonology specialist.  Allergic rhinitis is reported as moderately well-controlled with symptoms including occasional clear rhinorrhea and frequent sneezing.  She is currently using azelastine daily.  She is not currently using a nasal steroid spray, nasal saline rinse, or Atrovent.  She continues Clarinex twice a day and takes some DoTerra natural supplements.  Her last environmental skin testing was on 01/27/2022 and was positive to tree pollen, mold mix 2, mold mix 3, mold mix 4, and dust mite.  Allergic conjunctivitis is reported as well-controlled with no medical intervention at this time.  She does report that she has been experiencing phantosmia over the last 2 months.  She reports that she smells smoke intermittently almost every day for short periods of time.  She had been using Flonase and  has recently stopped using Flonase.  She is interested in trying Nasacort, azelastine, and saline rinses at this time.  If she does not experience resolution of phantosmia, she is interested in further evaluation with a neurology team.  She continues to avoid products containing straight milk.  She reports foods such as ice cream and milkshakes increase her cough.  Her last selected food allergy skin testing on 01/27/2022 was negative to cows milk and casein as well as her last lab testing.  She denies symptoms of reflux including heartburn and vomiting.  She reports she has never taken a medication to control reflux.  Her current medications are listed in the chart.   Drug Allergies:  Allergies  Allergen Reactions   Milk Protein Shortness Of Breath    ASTHMA FLARES    Physical Exam: BP 112/74 (BP Location: Left Arm, Patient Position: Sitting, Cuff Size: Normal)   Pulse 74   Temp 98.3 F (36.8 C) (Temporal)   Resp 18   Ht 5' 2.25" (1.581 m)   Wt 142 lb 11.2 oz (64.7 kg)   SpO2 100%   BMI 25.89 kg/m    Physical Exam Vitals reviewed.  Constitutional:      Appearance: Normal appearance.  HENT:     Head: Normocephalic and atraumatic.     Right Ear: Tympanic membrane normal.     Left Ear: Tympanic membrane normal.     Nose:     Comments: Septal deviation noted.  Bilateral nares slightly erythematous with clear nasal drainage noted.  Pharynx normal.  Ears normal.  Hearing aids  in use.  Eyes normal.    Mouth/Throat:     Pharynx: Oropharynx is clear.  Eyes:     Conjunctiva/sclera: Conjunctivae normal.  Cardiovascular:     Rate and Rhythm: Normal rate and regular rhythm.     Heart sounds: Normal heart sounds. No murmur heard. Pulmonary:     Effort: Pulmonary effort is normal.     Breath sounds: Normal breath sounds.     Comments: Lungs clear to auscultation Musculoskeletal:        General: Normal range of motion.     Cervical back: Normal range of motion and neck supple.   Skin:    General: Skin is warm and dry.  Neurological:     Mental Status: She is alert and oriented to person, place, and time.  Psychiatric:        Mood and Affect: Mood normal.        Behavior: Behavior normal.        Thought Content: Thought content normal.        Judgment: Judgment normal.     Diagnostics: FVC 2.39 which is 68% of predicted value, FEV1 2.23 which is 76% of predicted value.  Postbronchodilator FVC 2.44, FEV1 2.29.  Postbronchodilator spirometry indicates no significant improvement in FEV1.  This is consistent with previous spirometry readings.  Assessment and Plan: 1. Not well controlled severe persistent asthma   2. Adverse food reaction, subsequent encounter   3. Seasonal and perennial allergic rhinitis   4. Allergic conjunctivitis of both eyes   5. Gastroesophageal reflux disease, unspecified whether esophagitis present   6. Phantosmia     Meds ordered this encounter  Medications   famotidine (PEPCID) 20 MG tablet    Sig: Take 1 tablet (20 mg total) by mouth 2 (two) times daily.    Dispense:  60 tablet    Refill:  0    Patient Instructions  Asthma Continue montelukast 10 mg once a day to prevent cough or wheeze Continue Breztri 2 puffs twice a day with a spacer to prevent cough or wheeze Increase air supra 1 to 2 sprays as needed for cough or wheeze.  Do not use more than 12 sprays in 1 day Continue to avoid straight milk products as these have flared your asthma previously Continue Fasenra injections to control asthma symptoms  Chronic rhinitis Continue Clarinex 1 tablet once a day as needed for runny nose Begin Nasacort 2 sprays in each nostril once a day as needed for a stuffy nose.  In the right nostril, point the applicator out toward the right ear. In the left nostril, point the applicator out toward the left ear. This will replace Flonase Continue azelastine 2 sprays in each nostril up to twice a day as needed for runny nose Consider saline  nasal rinses as needed for nasal symptoms. Use this before any medicated nasal sprays for best result  Allergic conjunctivitis Some over the counter eye drops include Pataday one drop in each eye once a day as needed for red, itchy eyes OR Zaditor one drop in each eye twice a day as needed for red itchy eyes. Avoid eye drops that say red eye relief as they may contain medications that dry out your eyes.   Reflux Begin famotidine 20 mg twice a day for the next 30 days. This may decrease the tight feeling in your chest. If no improvement we will stop after 30 days  Phantosmia Keep a journal of when you experience this sensation and  how long it lasts Begin nasal rinses and Nasacort as above We will plan to refer you to neurology for further evaluation if your symptoms do not improve  Call the clinic if this treatment plan is not working well for you  Follow up in 1 month or sooner if needed.   Return in about 4 weeks (around 10/08/2022), or if symptoms worsen or fail to improve.    Thank you for the opportunity to care for this patient.  Please do not hesitate to contact me with questions.  Gareth Morgan, FNP [facility]

## 2022-09-10 NOTE — Patient Instructions (Addendum)
Asthma Continue montelukast 10 mg once a day to prevent cough or wheeze Continue Breztri 2 puffs twice a day with a spacer to prevent cough or wheeze Increase air supra 1 to 2 sprays as needed for cough or wheeze.  Do not use more than 12 sprays in 1 day Continue to avoid straight milk products as these have flared your asthma previously Continue Fasenra injections to control asthma symptoms  Chronic rhinitis Continue Clarinex 1 tablet once a day as needed for runny nose Begin Nasacort 2 sprays in each nostril once a day as needed for a stuffy nose.  In the right nostril, point the applicator out toward the right ear. In the left nostril, point the applicator out toward the left ear. This will replace Flonase Continue azelastine 2 sprays in each nostril up to twice a day as needed for runny nose Consider saline nasal rinses as needed for nasal symptoms. Use this before any medicated nasal sprays for best result  Allergic conjunctivitis Some over the counter eye drops include Pataday one drop in each eye once a day as needed for red, itchy eyes OR Zaditor one drop in each eye twice a day as needed for red itchy eyes. Avoid eye drops that say red eye relief as they may contain medications that dry out your eyes.   Reflux Begin famotidine 20 mg twice a day for the next 30 days. This may decrease the tight feeling in your chest. If no improvement we will stop after 30 days  Phantosmia Keep a journal of when you experience this sensation and how long it lasts Begin nasal rinses and Nasacort as above We will plan to refer you to neurology for further evaluation if your symptoms do not improve  Call the clinic if this treatment plan is not working well for you  Follow up in 1 month or sooner if needed.

## 2022-09-14 DIAGNOSIS — M222X1 Patellofemoral disorders, right knee: Secondary | ICD-10-CM | POA: Diagnosis not present

## 2022-09-15 ENCOUNTER — Encounter: Payer: Self-pay | Admitting: Family Medicine

## 2022-09-16 ENCOUNTER — Other Ambulatory Visit (HOSPITAL_COMMUNITY): Payer: Self-pay

## 2022-09-16 MED ORDER — DESLORATADINE 5 MG PO TABS
5.0000 mg | ORAL_TABLET | Freq: Every day | ORAL | 1 refills | Status: DC
  Filled 2022-09-24: qty 90, 90d supply, fill #0

## 2022-09-16 NOTE — Telephone Encounter (Signed)
Refill sent.

## 2022-09-16 NOTE — Telephone Encounter (Signed)
Ok to refill. Thank you.

## 2022-09-23 ENCOUNTER — Other Ambulatory Visit: Payer: Self-pay

## 2022-09-23 ENCOUNTER — Other Ambulatory Visit (HOSPITAL_COMMUNITY): Payer: Self-pay

## 2022-09-24 ENCOUNTER — Other Ambulatory Visit: Payer: Self-pay

## 2022-09-24 ENCOUNTER — Other Ambulatory Visit (HOSPITAL_COMMUNITY): Payer: Self-pay

## 2022-09-27 ENCOUNTER — Other Ambulatory Visit (HOSPITAL_COMMUNITY): Payer: Self-pay

## 2022-09-27 ENCOUNTER — Other Ambulatory Visit: Payer: Self-pay

## 2022-09-28 ENCOUNTER — Other Ambulatory Visit (HOSPITAL_COMMUNITY): Payer: Self-pay

## 2022-09-28 ENCOUNTER — Other Ambulatory Visit: Payer: Self-pay

## 2022-09-28 ENCOUNTER — Other Ambulatory Visit (HOSPITAL_BASED_OUTPATIENT_CLINIC_OR_DEPARTMENT_OTHER): Payer: Self-pay

## 2022-09-29 ENCOUNTER — Other Ambulatory Visit (HOSPITAL_COMMUNITY): Payer: Self-pay

## 2022-09-29 ENCOUNTER — Ambulatory Visit (HOSPITAL_BASED_OUTPATIENT_CLINIC_OR_DEPARTMENT_OTHER): Payer: Commercial Managed Care - PPO | Admitting: Family Medicine

## 2022-09-29 ENCOUNTER — Encounter (HOSPITAL_BASED_OUTPATIENT_CLINIC_OR_DEPARTMENT_OTHER): Payer: Self-pay

## 2022-10-08 ENCOUNTER — Other Ambulatory Visit: Payer: Self-pay

## 2022-10-08 ENCOUNTER — Encounter: Payer: Self-pay | Admitting: Family Medicine

## 2022-10-08 ENCOUNTER — Ambulatory Visit: Payer: Commercial Managed Care - PPO | Admitting: Family Medicine

## 2022-10-08 ENCOUNTER — Other Ambulatory Visit (HOSPITAL_COMMUNITY): Payer: Self-pay

## 2022-10-08 VITALS — BP 112/78 | HR 76 | Temp 97.2°F | Resp 18

## 2022-10-08 DIAGNOSIS — K219 Gastro-esophageal reflux disease without esophagitis: Secondary | ICD-10-CM | POA: Diagnosis not present

## 2022-10-08 DIAGNOSIS — R442 Other hallucinations: Secondary | ICD-10-CM | POA: Diagnosis not present

## 2022-10-08 DIAGNOSIS — J302 Other seasonal allergic rhinitis: Secondary | ICD-10-CM

## 2022-10-08 DIAGNOSIS — J4550 Severe persistent asthma, uncomplicated: Secondary | ICD-10-CM | POA: Diagnosis not present

## 2022-10-08 DIAGNOSIS — H1013 Acute atopic conjunctivitis, bilateral: Secondary | ICD-10-CM

## 2022-10-08 DIAGNOSIS — J3089 Other allergic rhinitis: Secondary | ICD-10-CM

## 2022-10-08 MED ORDER — AIRSUPRA 90-80 MCG/ACT IN AERO
2.0000 | INHALATION_SPRAY | RESPIRATORY_TRACT | 2 refills | Status: DC | PRN
  Filled 2022-10-08: qty 10.7, 30d supply, fill #0

## 2022-10-08 MED ORDER — AZELASTINE HCL 0.1 % NA SOLN
NASAL | 5 refills | Status: AC
  Filled 2022-10-08: qty 30, 30d supply, fill #0

## 2022-10-08 MED ORDER — MONTELUKAST SODIUM 10 MG PO TABS
10.0000 mg | ORAL_TABLET | Freq: Every day | ORAL | 5 refills | Status: DC
  Filled 2022-10-08: qty 30, 30d supply, fill #0
  Filled 2022-12-17: qty 30, 30d supply, fill #1
  Filled 2022-12-17: qty 90, 90d supply, fill #1
  Filled 2023-08-23: qty 30, 30d supply, fill #2

## 2022-10-08 MED ORDER — DESLORATADINE 5 MG PO TABS
5.0000 mg | ORAL_TABLET | Freq: Every day | ORAL | 1 refills | Status: DC
  Filled 2022-10-08 – 2022-12-17 (×2): qty 90, 90d supply, fill #0
  Filled 2023-06-21: qty 90, 90d supply, fill #1

## 2022-10-08 MED ORDER — BREZTRI AEROSPHERE 160-9-4.8 MCG/ACT IN AERO
2.0000 | INHALATION_SPRAY | Freq: Two times a day (BID) | RESPIRATORY_TRACT | 5 refills | Status: DC
  Filled 2022-10-08: qty 10.7, 30d supply, fill #0

## 2022-10-08 NOTE — Patient Instructions (Addendum)
Asthma Continue montelukast 10 mg once a day to prevent cough or wheeze Continue Breztri 2 puffs twice a day with a spacer to prevent cough or wheeze Continue air supra 1 to 2 sprays as needed for cough or wheeze.  Do not use more than 12 sprays in 1 day Continue to avoid all milk products as these have flared your asthma previously Continue Fasenra injections to control asthma symptoms  Chronic rhinitis Continue Clarinex 1 tablet once a day as needed for runny nose. You may take an additional tablet once a day as needed for breakthrough symptoms Continue Nasacort 2 sprays in each nostril once a day as needed for a stuffy nose.  In the right nostril, point the applicator out toward the right ear. In the left nostril, point the applicator out toward the left ear. This will replace Flonase Continue azelastine 2 sprays in each nostril up to twice a day as needed for runny nose Consider saline nasal rinses as needed for nasal symptoms. Use this before any medicated nasal sprays for best result  Allergic conjunctivitis Some over the counter eye drops include Pataday one drop in each eye once a day as needed for red, itchy eyes OR Zaditor one drop in each eye twice a day as needed for red itchy eyes. Avoid eye drops that say red eye relief as they may contain medications that dry out your eyes.   Reflux Stop famotidine Continue dietary and lifestyle modifications as listed below  Phantosmia Keep a journal of when you experience this sensation and how long it lasts Begin nasal rinses and Nasacort as above We will plan to refer you to neurology for further evaluation if your symptoms do not improve  Call the clinic if this treatment plan is not working well for you  Follow up in 3 months or sooner if needed.

## 2022-10-08 NOTE — Progress Notes (Signed)
[address] [place] [address] Dept: [phone]  FOLLOW UP NOTE  Patient ID: Cindy Dunn, female    DOB: 04-Dec-1985  Age: 37 y.o. MRN: [id number] Date of Office Visit: 10/08/2022  Assessment  Chief Complaint: Follow-up and Asthma  HPI Cindy Dunn is a 37 year old female who presents to the clinic for follow-up visit.  She was last seen in this clinic on 09/10/2022 by Thermon Leyland, FNP, for evaluation of poorly controlled asthma, allergic rhinitis, allergic conjunctivitis, reflux, idiopathic allergic reaction, and phantosmia.  She is accompanied by her mother who assists with history.  At today's visit, she reports her asthma has been moderately well-controlled with occasional cough, wheeze, and shortness of breath.  She reports the symptoms mainly occur at work after she has eaten something containing a milk product such as pizza with cheese or macaroni and cheese.  She reports that she does not experience any asthma symptoms while at home.  She reports that she does have stairs in her home and she can ascend and descend the stairs without any shortness of breath, cough, or wheeze.  She continues to avoid straight milk, however, has been incorporating foods containing baked cheese and baked milk which have exacerbated her asthma symptoms.  She continues montelukast 10 mg once a day, Breztri 2 puffs twice a day, Fasenra injections once, and has used air supra about once a week with relief of symptoms.  Allergic rhinitis is reported as moderately well-controlled with occasional clear rhinorrhea as the main symptom.  She continues desloratadine once or twice a day as needed with relief of clear rhinorrhea.  She does continue to experience phantosmia which is not affecting her appetite or daily activities.  She will continue Nasacort, azelastine, and saline rinses at this time.  If any increase in her symptom frequency, she will consider evaluation with a neurology team.  Reflux is  reported as well-controlled with no symptoms including heartburn or vomiting.  She has been taking famotidine daily and reports no change in her breathing symptoms.  We will discontinue famotidine at this time.  Her current medications are listed in the chart.   Drug Allergies:  Allergies  Allergen Reactions   Milk Protein Shortness Of Breath    ASTHMA FLARES    Physical Exam: BP 112/78 (BP Location: Right Arm, Patient Position: Sitting, Cuff Size: Normal)   Pulse 76   Temp (!) 97.2 F (36.2 C) (Temporal)   Resp 18   SpO2 100%    Physical Exam Vitals reviewed.  Constitutional:      Appearance: Normal appearance.  HENT:     Head: Normocephalic and atraumatic.     Right Ear: Tympanic membrane normal.     Left Ear: Tympanic membrane normal.     Nose:     Comments: Bilateral naris slightly erythematous with clear nasal drainage noted.  Pharynx normal.  Ears normal.  Eyes normal.  Bilateral hearing aids in use    Mouth/Throat:     Pharynx: Oropharynx is clear.  Eyes:     Conjunctiva/sclera: Conjunctivae normal.  Cardiovascular:     Rate and Rhythm: Normal rate and regular rhythm.     Heart sounds: Normal heart sounds. No murmur heard. Pulmonary:     Effort: Pulmonary effort is normal.     Breath sounds: Normal breath sounds.     Comments: Lungs clear to auscultation Musculoskeletal:        General: Normal range of motion.     Cervical back:  Normal range of motion and neck supple.  Skin:    General: Skin is warm and dry.  Neurological:     Mental Status: She is alert and oriented to person, place, and time.  Psychiatric:        Mood and Affect: Mood normal.        Behavior: Behavior normal.        Thought Content: Thought content normal.        Judgment: Judgment normal.     Diagnostics: FVC 2.71 which is 77.6% of predicted value, FEV1 2.47 which is 84.8% of predicted value. Spirometry indicates normal ventilatory function  Assessment and Plan: 1. Not well  controlled severe persistent asthma   2. Seasonal and perennial allergic rhinitis   3. Allergic conjunctivitis of both eyes   4. Gastroesophageal reflux disease, unspecified whether esophagitis present   5. Phantosmia     Meds ordered this encounter  Medications   AIRSUPRA 90-80 MCG/ACT AERO    Sig: Inhale 2 puffs into the lungs as needed (maximum 12 puffs/day).    Dispense:  10.7 g    Refill:  2   montelukast (SINGULAIR) 10 MG tablet    Sig: Take 1 tablet (10 mg total) by mouth at bedtime.    Dispense:  30 tablet    Refill:  5   BREZTRI AEROSPHERE 160-9-4.8 MCG/ACT AERO    Sig: Inhale 2 puffs into the lungs in the morning and at bedtime.    Dispense:  10.7 g    Refill:  5   desloratadine (CLARINEX) 5 MG tablet    Sig: Take 1 tablet (5 mg total) by mouth daily.    Dispense:  90 tablet    Refill:  1   azelastine (ASTELIN) 0.1 % nasal spray    Sig: 2 sprays each nostril 2 times daily as needed for runny nose.    Dispense:  30 mL    Refill:  5    Patient Instructions  Asthma Continue montelukast 10 mg once a day to prevent cough or wheeze Continue Breztri 2 puffs twice a day with a spacer to prevent cough or wheeze Continue air supra 1 to 2 sprays as needed for cough or wheeze.  Do not use more than 12 sprays in 1 day Continue to avoid all milk products as these have flared your asthma previously Continue Fasenra injections to control asthma symptoms  Chronic rhinitis Continue Clarinex 1 tablet once a day as needed for runny nose. You may take an additional tablet once a day as needed for breakthrough symptoms Continue Nasacort 2 sprays in each nostril once a day as needed for a stuffy nose.  In the right nostril, point the applicator out toward the right ear. In the left nostril, point the applicator out toward the left ear. This will replace Flonase Continue azelastine 2 sprays in each nostril up to twice a day as needed for runny nose Consider saline nasal rinses as needed  for nasal symptoms. Use this before any medicated nasal sprays for best result  Allergic conjunctivitis Some over the counter eye drops include Pataday one drop in each eye once a day as needed for red, itchy eyes OR Zaditor one drop in each eye twice a day as needed for red itchy eyes. Avoid eye drops that say red eye relief as they may contain medications that dry out your eyes.   Reflux Stop famotidine Continue dietary and lifestyle modifications as listed below  Phantosmia Keep a journal  of when you experience this sensation and how long it lasts Begin nasal rinses and Nasacort as above We will plan to refer you to neurology for further evaluation if your symptoms do not improve  Call the clinic if this treatment plan is not working well for you  Follow up in 3 months or sooner if needed.  Return in about 3 months (around 01/07/2023), or if symptoms worsen or fail to improve.    Thank you for the opportunity to care for this patient.  Please do not hesitate to contact me with questions.  Thermon Leyland, FNP [facility]

## 2022-10-11 ENCOUNTER — Other Ambulatory Visit: Payer: Self-pay

## 2022-10-15 ENCOUNTER — Other Ambulatory Visit: Payer: Self-pay

## 2022-11-17 ENCOUNTER — Other Ambulatory Visit (HOSPITAL_COMMUNITY): Payer: Self-pay

## 2022-11-19 ENCOUNTER — Other Ambulatory Visit (HOSPITAL_COMMUNITY): Payer: Self-pay

## 2022-11-23 ENCOUNTER — Other Ambulatory Visit (HOSPITAL_COMMUNITY): Payer: Self-pay

## 2022-12-13 ENCOUNTER — Encounter (HOSPITAL_BASED_OUTPATIENT_CLINIC_OR_DEPARTMENT_OTHER): Payer: Self-pay

## 2022-12-13 ENCOUNTER — Encounter (HOSPITAL_BASED_OUTPATIENT_CLINIC_OR_DEPARTMENT_OTHER): Payer: 59 | Admitting: Nurse Practitioner

## 2022-12-17 ENCOUNTER — Other Ambulatory Visit (HOSPITAL_COMMUNITY): Payer: Self-pay

## 2022-12-17 ENCOUNTER — Encounter: Payer: Self-pay | Admitting: Family Medicine

## 2022-12-17 ENCOUNTER — Other Ambulatory Visit: Payer: Self-pay

## 2022-12-20 ENCOUNTER — Other Ambulatory Visit (HOSPITAL_COMMUNITY): Payer: Self-pay

## 2022-12-20 NOTE — Telephone Encounter (Signed)
Can you please have her make a follow up appointment with Dr. Maurine Minister? Thank you

## 2022-12-23 ENCOUNTER — Other Ambulatory Visit: Payer: Self-pay

## 2023-01-26 ENCOUNTER — Other Ambulatory Visit: Payer: Self-pay | Admitting: Oncology

## 2023-01-26 DIAGNOSIS — Z006 Encounter for examination for normal comparison and control in clinical research program: Secondary | ICD-10-CM

## 2023-01-27 ENCOUNTER — Other Ambulatory Visit (HOSPITAL_COMMUNITY)
Admission: RE | Admit: 2023-01-27 | Discharge: 2023-01-27 | Disposition: A | Discharge status: Home / Self Care | Payer: Commercial Managed Care - PPO | Source: Ambulatory Visit | Attending: Oncology | Admitting: Oncology

## 2023-01-27 DIAGNOSIS — Z006 Encounter for examination for normal comparison and control in clinical research program: Secondary | ICD-10-CM | POA: Insufficient documentation

## 2023-02-01 IMAGING — DX DG CHEST 2V
2 series · 2 of 2 positions shown · non-contrast
Comparison: 06/17/2015

CLINICAL DATA: Chronic cough.

EXAM:
CHEST - 2 VIEW

[chest pa]
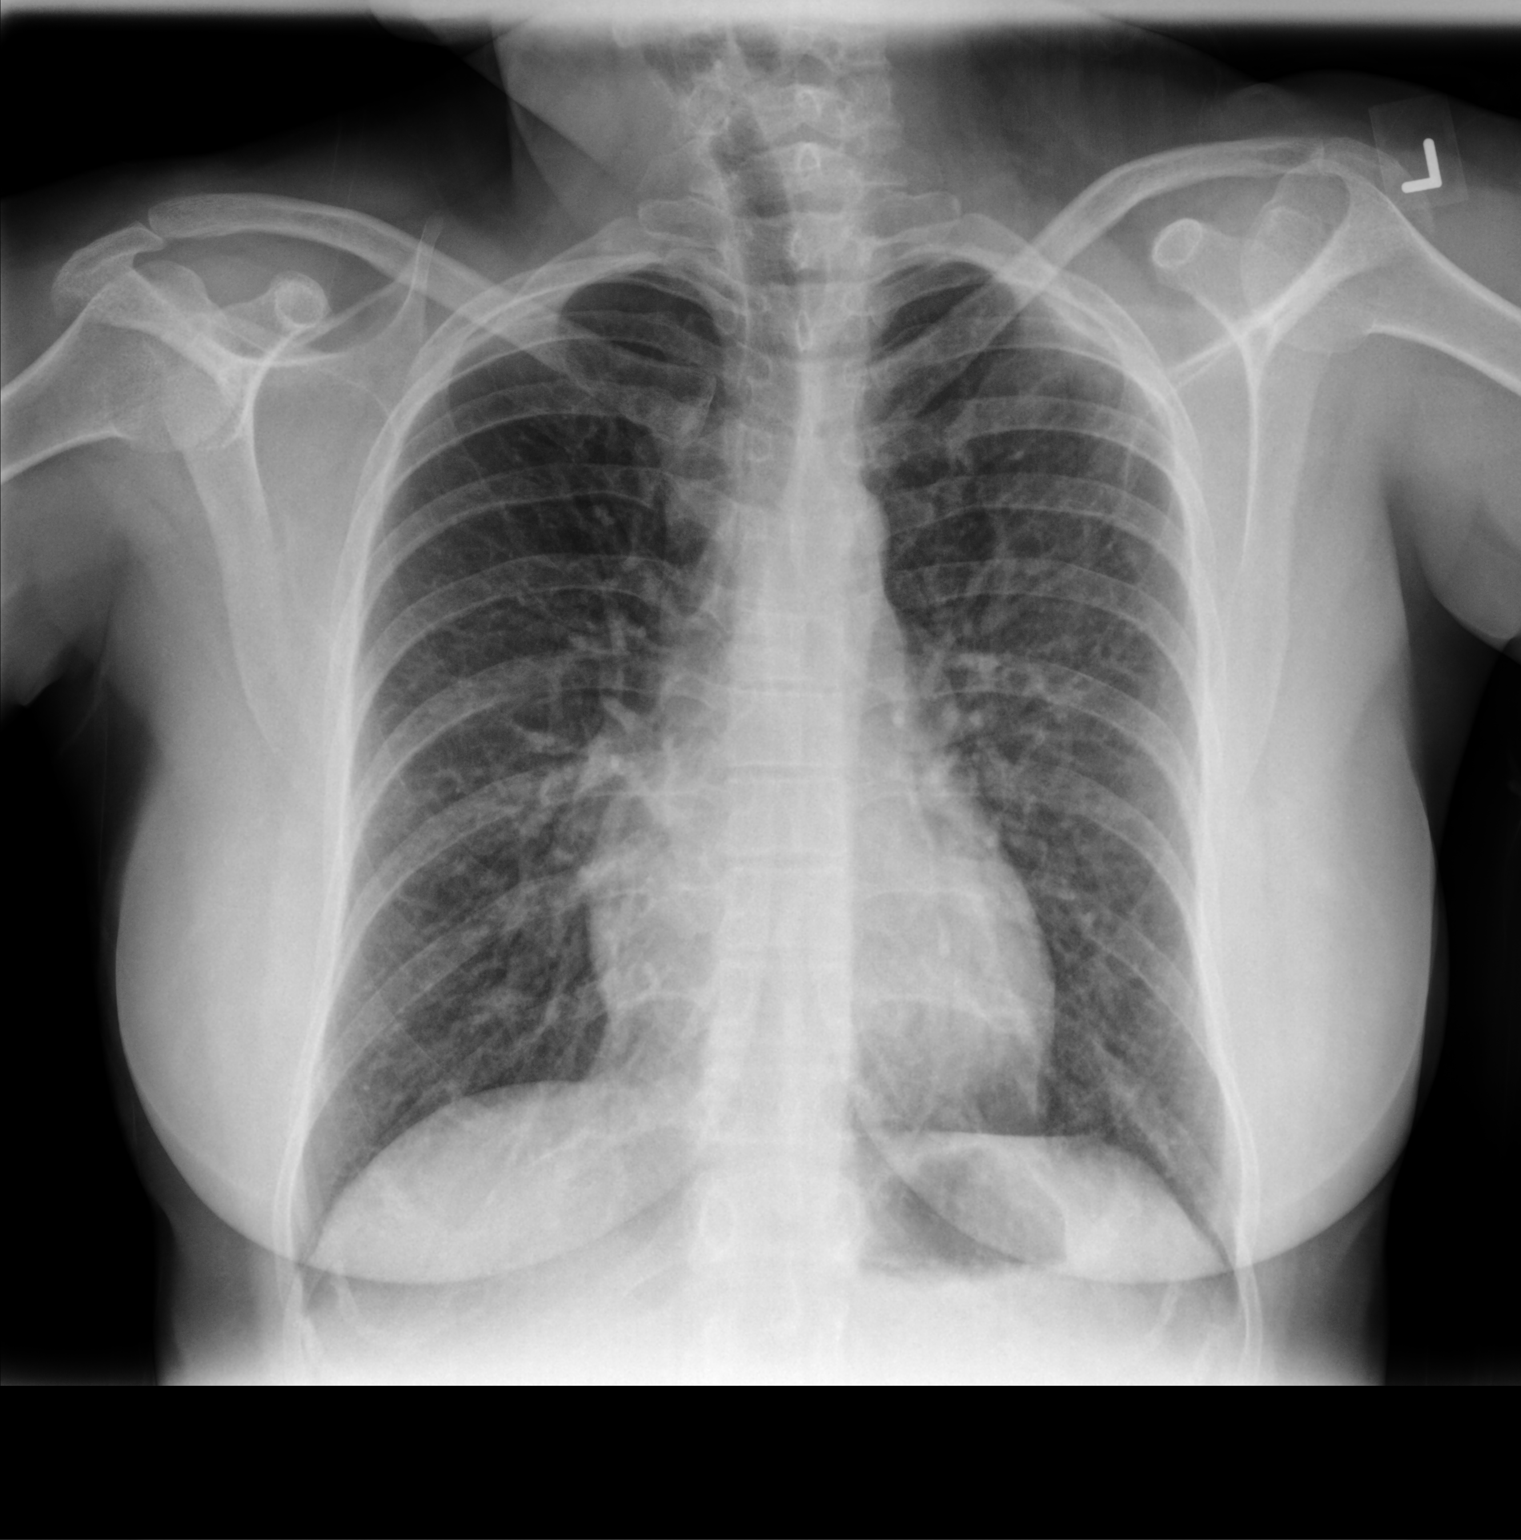

[chest lat]
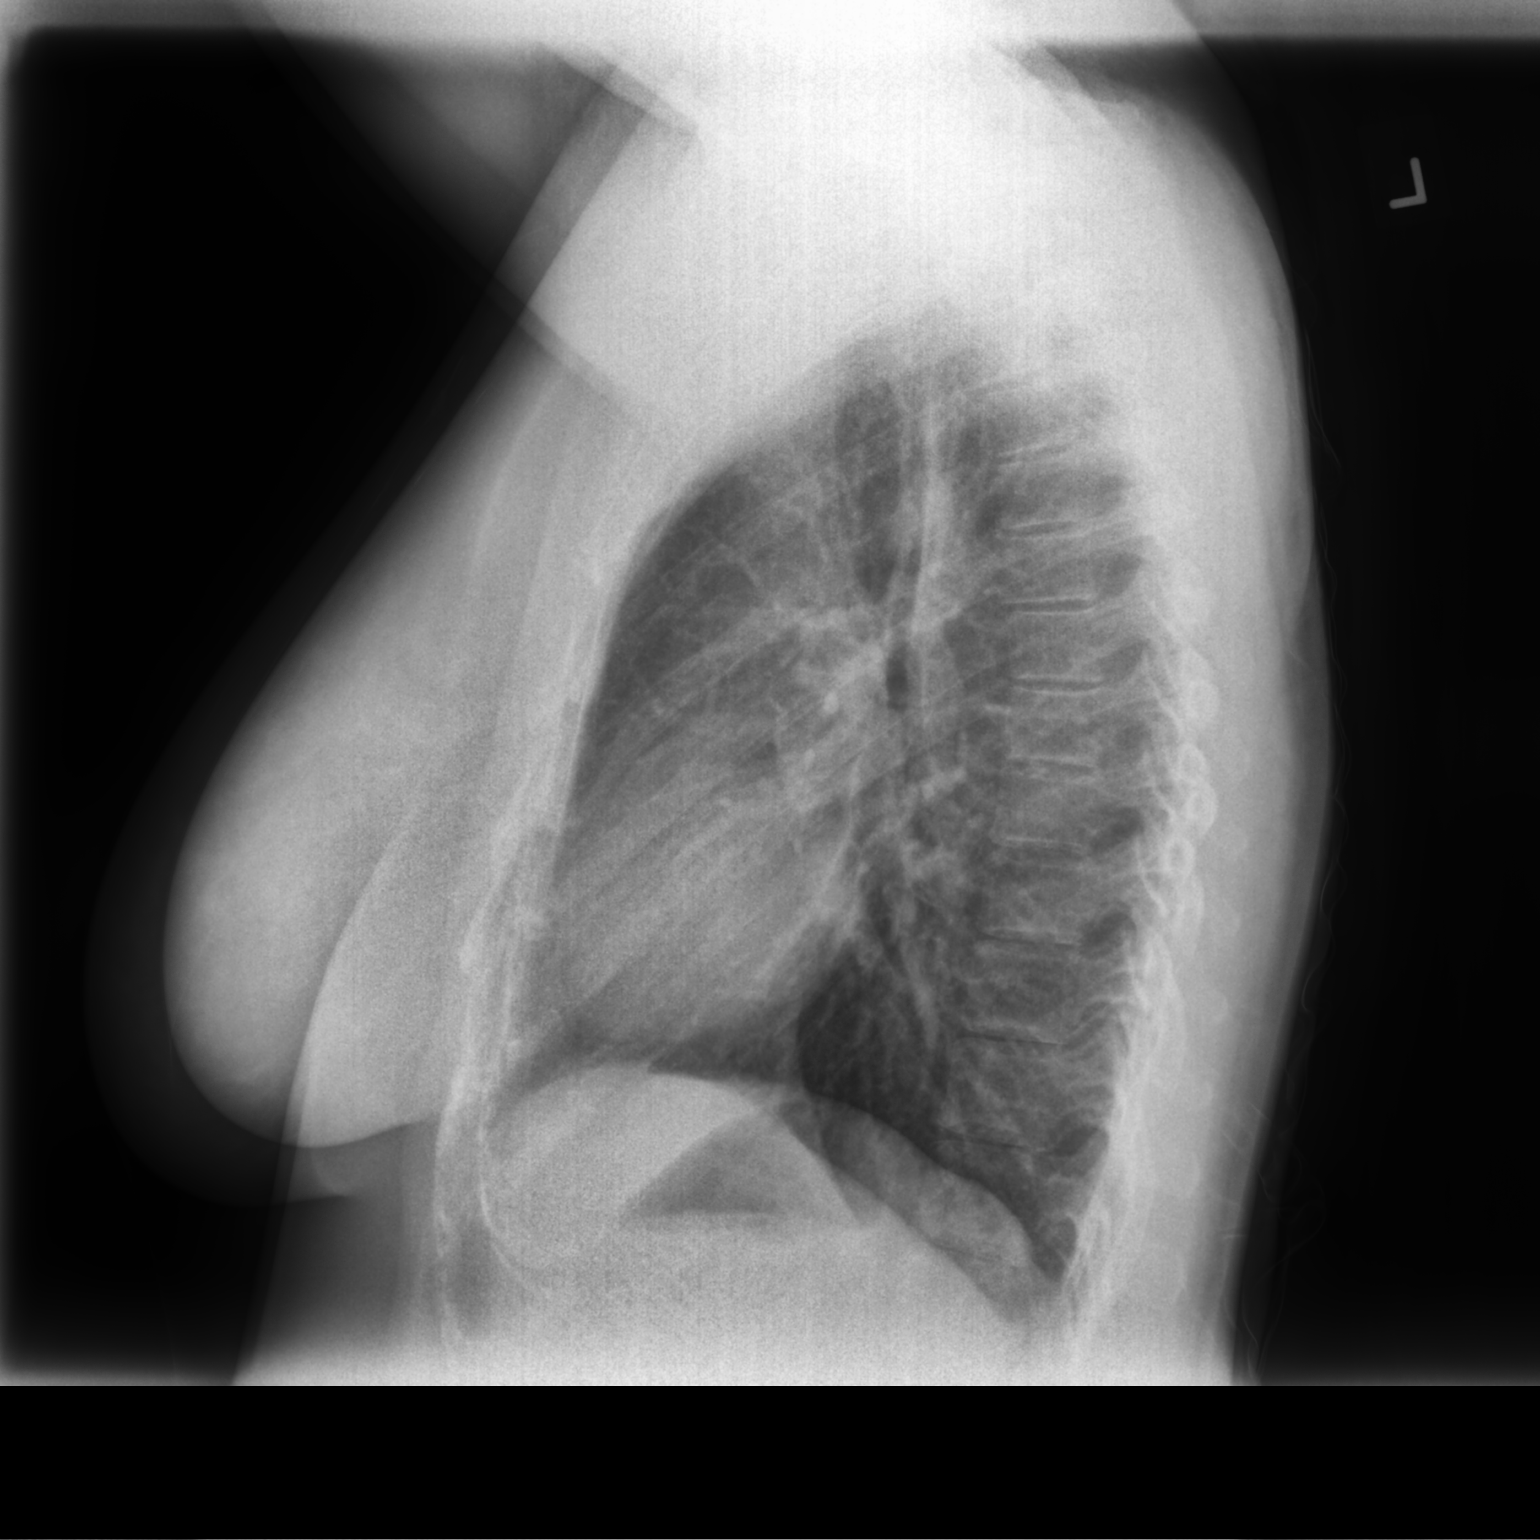

[2 of 2 positions shown; findings below may reference images not displayed]

FINDINGS: Grossly unchanged cardiac silhouette and mediastinal contours.
Diffuse slightly nodular thickening of the pulmonary interstitium,
progressed compared to the 7327 examination though potentially
accentuated due to technique. No focal airspace opacities. No
pleural effusion or pneumothorax. No evidence of edema. No acute
osseous abnormalities.
IMPRESSION: Findings suggestive of chronic airways disease / bronchitis, likely
progressed compared to remote examination performed in 7327. No
focal airspace opacities to suggest pneumonia.

## 2023-02-08 ENCOUNTER — Encounter (INDEPENDENT_AMBULATORY_CARE_PROVIDER_SITE_OTHER): Payer: Self-pay

## 2023-02-14 ENCOUNTER — Other Ambulatory Visit: Payer: Self-pay

## 2023-03-04 ENCOUNTER — Encounter: Payer: Commercial Managed Care - PPO | Admitting: Nurse Practitioner

## 2023-03-10 ENCOUNTER — Encounter: Payer: Commercial Managed Care - PPO | Admitting: Nurse Practitioner

## 2023-04-01 ENCOUNTER — Encounter: Payer: Commercial Managed Care - PPO | Admitting: Nurse Practitioner

## 2023-05-06 DIAGNOSIS — M222X2 Patellofemoral disorders, left knee: Secondary | ICD-10-CM | POA: Diagnosis not present

## 2023-05-19 ENCOUNTER — Encounter (HOSPITAL_BASED_OUTPATIENT_CLINIC_OR_DEPARTMENT_OTHER): Payer: Self-pay | Admitting: Family Medicine

## 2023-06-23 ENCOUNTER — Other Ambulatory Visit (HOSPITAL_COMMUNITY): Payer: Self-pay

## 2023-08-11 ENCOUNTER — Other Ambulatory Visit: Payer: Self-pay | Admitting: Family Medicine

## 2023-08-12 ENCOUNTER — Other Ambulatory Visit (HOSPITAL_COMMUNITY): Payer: Self-pay

## 2023-08-12 ENCOUNTER — Other Ambulatory Visit: Payer: Self-pay | Admitting: Family Medicine

## 2023-08-12 MED ORDER — DESLORATADINE 5 MG PO TABS
5.0000 mg | ORAL_TABLET | Freq: Every day | ORAL | 1 refills | Status: DC
  Filled 2023-08-12 – 2023-10-05 (×2): qty 90, 90d supply, fill #0
  Filled 2024-01-03: qty 90, 90d supply, fill #1

## 2023-08-13 ENCOUNTER — Other Ambulatory Visit (HOSPITAL_COMMUNITY): Payer: Self-pay

## 2023-08-23 ENCOUNTER — Other Ambulatory Visit (HOSPITAL_COMMUNITY): Payer: Self-pay

## 2023-10-05 ENCOUNTER — Other Ambulatory Visit: Payer: Self-pay

## 2023-10-05 ENCOUNTER — Other Ambulatory Visit (HOSPITAL_COMMUNITY): Payer: Self-pay

## 2023-10-15 ENCOUNTER — Other Ambulatory Visit: Payer: Self-pay | Admitting: Family Medicine

## 2023-10-17 ENCOUNTER — Other Ambulatory Visit (HOSPITAL_COMMUNITY): Payer: Self-pay

## 2023-10-17 MED ORDER — MONTELUKAST SODIUM 10 MG PO TABS
10.0000 mg | ORAL_TABLET | Freq: Every day | ORAL | 0 refills | Status: DC
  Filled 2023-10-17: qty 30, 30d supply, fill #0

## 2023-10-18 ENCOUNTER — Other Ambulatory Visit (HOSPITAL_COMMUNITY): Payer: Self-pay

## 2023-11-09 ENCOUNTER — Other Ambulatory Visit (HOSPITAL_COMMUNITY): Payer: Self-pay

## 2023-11-09 ENCOUNTER — Other Ambulatory Visit: Payer: Self-pay | Admitting: Family Medicine

## 2023-11-09 MED ORDER — MONTELUKAST SODIUM 10 MG PO TABS
10.0000 mg | ORAL_TABLET | Freq: Every day | ORAL | 1 refills | Status: DC
  Filled 2023-11-09 – 2023-11-10 (×2): qty 90, 90d supply, fill #0
  Filled 2024-02-24: qty 90, 90d supply, fill #1

## 2023-11-10 ENCOUNTER — Other Ambulatory Visit (HOSPITAL_COMMUNITY): Payer: Self-pay

## 2023-11-10 ENCOUNTER — Other Ambulatory Visit: Payer: Self-pay

## 2024-01-15 ENCOUNTER — Other Ambulatory Visit: Payer: Self-pay | Admitting: Family Medicine

## 2024-01-16 ENCOUNTER — Other Ambulatory Visit (HOSPITAL_COMMUNITY): Payer: Self-pay

## 2024-01-16 MED ORDER — AIRSUPRA 90-80 MCG/ACT IN AERO
2.0000 | INHALATION_SPRAY | RESPIRATORY_TRACT | 0 refills | Status: AC | PRN
  Filled 2024-01-16: qty 10.7, 30d supply, fill #0

## 2024-03-20 ENCOUNTER — Telehealth: Payer: Self-pay

## 2024-03-20 NOTE — Telephone Encounter (Signed)
 Copied from CRM #[id number]. Topic: Clinical - Medication Question >> Mar 20, 2024  9:58 AM Myrick T wrote: Reason for CRM: patient called to see how long she would need to be on birth control before having safe sex or what alternative she could use in the meantime. Patient is getting married on 11/8 and does not want to get pregnant. Please f/u with patient as she has an appt on 10/28.

## 2024-03-22 NOTE — Telephone Encounter (Signed)
 If oral estrogen and progesterone birth control pills are started on the first day of a period then they are considered effective immediately. If started at any other time during the menstrual cycle, they are considered effective 7 days after starting.   Alternative contraceptive management includes IUD (device inserted into the uterus that lasts several years for long term pregnancy prevention), depo provera shot (progesterone only injection every 3 months), Nexplanon (progesterone only implant into the arm that lasts 3 years), progesterone only pill (taken daily like regular birth control pills- not always as effective), or condoms.   I do not insert IUD, but we can send a referral for GYN if she would like to have this done.

## 2024-03-27 ENCOUNTER — Telehealth: Payer: Self-pay

## 2024-03-27 NOTE — Telephone Encounter (Signed)
 Copied from CRM #[id number]. Topic: General - Other >> Mar 27, 2024 11:42 AM Cindy Dunn wrote: Reason for CRM: patient calling back to speak to [facility]. Missed phone call.

## 2024-03-28 ENCOUNTER — Encounter: Payer: Self-pay | Admitting: Nurse Practitioner

## 2024-03-28 ENCOUNTER — Telehealth: Admitting: Nurse Practitioner

## 2024-03-28 NOTE — Progress Notes (Signed)
 Unable to reach patient at time of visit. Will attempt to reach patient at later time.

## 2024-04-09 ENCOUNTER — Other Ambulatory Visit: Payer: Self-pay | Admitting: Family Medicine

## 2024-04-10 ENCOUNTER — Other Ambulatory Visit: Payer: Self-pay | Admitting: Family Medicine

## 2024-04-10 ENCOUNTER — Other Ambulatory Visit (HOSPITAL_COMMUNITY): Payer: Self-pay

## 2024-04-10 MED ORDER — DESLORATADINE 5 MG PO TABS
5.0000 mg | ORAL_TABLET | Freq: Every day | ORAL | 0 refills | Status: AC
  Filled 2024-04-10: qty 30, 30d supply, fill #0

## 2024-04-10 NOTE — Addendum Note (Signed)
 Addended by: MARCINE ISAIAH CROME on: 04/10/2024 01:55 PM   Modules accepted: Orders

## 2024-04-12 ENCOUNTER — Other Ambulatory Visit (HOSPITAL_COMMUNITY): Payer: Self-pay

## 2024-04-12 ENCOUNTER — Encounter (HOSPITAL_COMMUNITY): Payer: Self-pay

## 2024-04-12 ENCOUNTER — Ambulatory Visit: Admitting: Family Medicine

## 2024-04-15 NOTE — Patient Instructions (Incomplete)
Asthma Continue montelukast 10 mg once a day to prevent cough or wheeze Continue Breztri 2 puffs twice a day with a spacer to prevent cough or wheeze Continue air supra 1 to 2 sprays as needed for cough or wheeze.  Do not use more than 12 sprays in 1 day Continue to avoid all milk products as these have flared your asthma previously Continue Fasenra injections to control asthma symptoms  Chronic rhinitis Continue Clarinex 1 tablet once a day as needed for runny nose. You may take an additional tablet once a day as needed for breakthrough symptoms Continue Nasacort 2 sprays in each nostril once a day as needed for a stuffy nose.  In the right nostril, point the applicator out toward the right ear. In the left nostril, point the applicator out toward the left ear. This will replace Flonase Continue azelastine 2 sprays in each nostril up to twice a day as needed for runny nose Consider saline nasal rinses as needed for nasal symptoms. Use this before any medicated nasal sprays for best result  Allergic conjunctivitis Some over the counter eye drops include Pataday one drop in each eye once a day as needed for red, itchy eyes OR Zaditor one drop in each eye twice a day as needed for red itchy eyes. Avoid eye drops that say red eye relief as they may contain medications that dry out your eyes.   Reflux Stop famotidine Continue dietary and lifestyle modifications as listed below  Phantosmia Keep a journal of when you experience this sensation and how long it lasts Begin nasal rinses and Nasacort as above We will plan to refer you to neurology for further evaluation if your symptoms do not improve  Call the clinic if this treatment plan is not working well for you  Follow up in 3 months or sooner if needed.

## 2024-04-15 NOTE — Progress Notes (Unsigned)
   [address] [place] [address] Dept: [phone]  FOLLOW UP NOTE  Patient ID: Cindy Dunn, female    DOB: 1985-07-30  Age: 38 y.o. MRN: [id number] Date of Office Visit: 04/16/2024  Assessment  Chief Complaint: No chief complaint on file.  HPI Cindy Dunn is a 38 year old female who presents to the clinic for evaluation of asthma, allergic rhinitis, allergic conjunctivitis, reflux, and phantosmia. Her last environmental allergy  testing was positive to tree pollen, mold, and dust mites on 01/27/2022.  Discussed the use of AI scribe software for clinical note transcription with the patient, who gave verbal consent to proceed.  History of Present Illness      Drug Allergies:  Allergies  Allergen Reactions   Milk Protein Shortness Of Breath    ASTHMA FLARES    Physical Exam: LMP 03/18/2024    Physical Exam  Diagnostics:    Assessment and Plan: No diagnosis found.  No orders of the defined types were placed in this encounter.   There are no Patient Instructions on file for this visit.  No follow-ups on file.    Thank you for the opportunity to care for this patient.  Please do not hesitate to contact me with questions.  Arlean Mutter, FNP [facility]

## 2024-04-16 ENCOUNTER — Other Ambulatory Visit (HOSPITAL_COMMUNITY): Payer: Self-pay

## 2024-04-16 ENCOUNTER — Ambulatory Visit: Admitting: Family Medicine

## 2024-04-16 ENCOUNTER — Encounter: Payer: Self-pay | Admitting: Family Medicine

## 2024-04-16 ENCOUNTER — Other Ambulatory Visit: Payer: Self-pay

## 2024-04-16 VITALS — BP 100/70 | HR 78 | Temp 98.4°F | Ht 62.25 in | Wt 131.8 lb

## 2024-04-16 DIAGNOSIS — J302 Other seasonal allergic rhinitis: Secondary | ICD-10-CM | POA: Diagnosis not present

## 2024-04-16 DIAGNOSIS — K219 Gastro-esophageal reflux disease without esophagitis: Secondary | ICD-10-CM

## 2024-04-16 DIAGNOSIS — J3089 Other allergic rhinitis: Secondary | ICD-10-CM

## 2024-04-16 DIAGNOSIS — H1013 Acute atopic conjunctivitis, bilateral: Secondary | ICD-10-CM

## 2024-04-16 DIAGNOSIS — R442 Other hallucinations: Secondary | ICD-10-CM | POA: Diagnosis not present

## 2024-04-16 DIAGNOSIS — J4520 Mild intermittent asthma, uncomplicated: Secondary | ICD-10-CM | POA: Insufficient documentation

## 2024-04-16 MED ORDER — DESLORATADINE 5 MG PO TABS
5.0000 mg | ORAL_TABLET | Freq: Every day | ORAL | 1 refills | Status: AC
  Filled 2024-04-16 – 2024-05-13 (×2): qty 90, 90d supply, fill #0

## 2024-04-17 ENCOUNTER — Other Ambulatory Visit (HOSPITAL_COMMUNITY): Payer: Self-pay

## 2024-04-24 ENCOUNTER — Other Ambulatory Visit (HOSPITAL_COMMUNITY): Payer: Self-pay

## 2024-04-24 ENCOUNTER — Ambulatory Visit (INDEPENDENT_AMBULATORY_CARE_PROVIDER_SITE_OTHER): Admitting: Nurse Practitioner

## 2024-04-24 VITALS — BP 120/72 | HR 64 | Ht 62.0 in | Wt 133.2 lb

## 2024-04-24 DIAGNOSIS — Z130 Encounter for screening for diseases of the blood and blood-forming organs and certain disorders involving the immune mechanism: Secondary | ICD-10-CM | POA: Diagnosis not present

## 2024-04-24 DIAGNOSIS — Z13228 Encounter for screening for other metabolic disorders: Secondary | ICD-10-CM | POA: Diagnosis not present

## 2024-04-24 DIAGNOSIS — Z30013 Encounter for initial prescription of injectable contraceptive: Secondary | ICD-10-CM | POA: Diagnosis not present

## 2024-04-24 DIAGNOSIS — Z139 Encounter for screening, unspecified: Secondary | ICD-10-CM | POA: Diagnosis not present

## 2024-04-24 DIAGNOSIS — J4520 Mild intermittent asthma, uncomplicated: Secondary | ICD-10-CM

## 2024-04-24 DIAGNOSIS — T7819XD Other adverse food reactions, not elsewhere classified, subsequent encounter: Secondary | ICD-10-CM

## 2024-04-24 DIAGNOSIS — Z1329 Encounter for screening for other suspected endocrine disorder: Secondary | ICD-10-CM | POA: Diagnosis not present

## 2024-04-24 DIAGNOSIS — T782XXD Anaphylactic shock, unspecified, subsequent encounter: Secondary | ICD-10-CM

## 2024-04-24 DIAGNOSIS — Z0000 Encounter for general adult medical examination without abnormal findings: Secondary | ICD-10-CM

## 2024-04-24 DIAGNOSIS — Z1321 Encounter for screening for nutritional disorder: Secondary | ICD-10-CM | POA: Diagnosis not present

## 2024-04-24 MED ORDER — EPINEPHRINE 0.3 MG/0.3ML IJ SOAJ
0.3000 mg | INTRAMUSCULAR | 2 refills | Status: AC | PRN
  Filled 2024-04-24: qty 2, 7d supply, fill #0

## 2024-04-24 MED ORDER — MEDROXYPROGESTERONE ACETATE 150 MG/ML IM SUSP
150.0000 mg | INTRAMUSCULAR | Status: AC
  Administered 2024-04-24: 150 mg via INTRAMUSCULAR

## 2024-04-24 NOTE — Progress Notes (Unsigned)
 Cindy Doing, DNP, AGNP-c [facility] [address] [place], [place] [address] Main Office [phone] VISIT TYPE: CPE on 04/24/2024 Today's Vitals   04/24/24 1401  BP: 120/72  Pulse: 64  Weight: 133 lb 3.2 oz (60.4 kg)  Height: 5' 2 (1.575 m)   Body mass index is 24.36 kg/m. BP 120/72   Pulse 64   Ht 5' 2 (1.575 m)   Wt 133 lb 3.2 oz (60.4 kg)   LMP 03/18/2024   BMI 24.36 kg/m   Subjective:    Patient ID: Cindy Dunn, female    DOB: 08/04/1985, 38 y.o.   MRN: [id number]  HPI: History of Present Illness Cindy Dunn is a 38 year old female who presents for her annual exam and a consultation regarding birth control options before her upcoming wedding.  She is seeking to change her birth control method before her wedding, which is scheduled for next Saturday. She has previously used Yaz but does not recall its effectiveness due to a traumatic experience. She is considering different options, including the Depo-Provera shot.  She has a history of asthma and a milk allergy . She avoids non-baked dairy products and uses dairy-free alternatives. She continues to take her asthma medication regularly to prevent flare-ups.  She reports being cold-natured but has recently experienced episodes of feeling hot, which is unusual for her. These episodes may be related to not eating regularly, as she sometimes forgets to eat due to her busy schedule. She uses dairy-free protein drinks to supplement her diet.  No current concerns with hearing, vision, shortness of breath, chest pain, joint pain, or skin changes. Her periods are regular, and she does not experience swelling in her feet.  Past Medical History - Asthma with milk allergy     Pertinent items are noted in HPI.  Most Recent Depression Screen:     04/24/2024    1:59 PM 03/28/2024   10:49 AM 02/03/2022    1:57 PM 12/22/2021    9:43 AM 12/10/2021    8:31 AM  Depression screen PHQ 2/9  Decreased  Interest 0 0 0 0 0  Down, Depressed, Hopeless 0 0 0 0 0  PHQ - 2 Score 0 0 0 0 0  Altered sleeping   0    Tired, decreased energy   0    Change in appetite   0    Feeling bad or failure about yourself    0    Trouble concentrating   0    Moving slowly or fidgety/restless   0    Suicidal thoughts   0    PHQ-9 Score   0    Difficult Dunn work/chores   Not difficult at all     Most Recent Anxiety Screen:      No data to display         Most Recent Fall Screen:    04/24/2024    1:59 PM 03/28/2024   10:49 AM 02/03/2022    1:56 PM 12/22/2021    9:43 AM 12/10/2021    8:30 AM  Fall Risk   Falls in the past year? 0 0 0 0 0  Number falls in past yr: 0 0 0 0 0  Injury with Fall? 0 0 0 0 0  Risk for fall due to : No Fall Risks No Fall Risks No Fall Risks No Fall Risks No Fall Risks  Follow up Falls evaluation completed Falls evaluation completed Falls evaluation completed  Falls evaluation completed  Falls evaluation completed      Data saved with a previous flowsheet row definition    Past medical history, surgical history, medications, allergies, family history and social history reviewed with patient today and changes made to appropriate areas of the chart.  Past Medical History:  Past Medical History:  Diagnosis Date   Asthma    Medications:  Current Outpatient Medications on File Prior to Visit  Medication Sig   AIRSUPRA  90-80 MCG/ACT AERO Inhale 2 puffs into the lungs as needed (maximum 12 puffs/day).   azelastine  (ASTELIN ) 0.1 % nasal spray Use 2 sprays in each nostril 2 times daily as needed for runny nose.   Cholecalciferol (VITAMIN D3) 250 MCG (10000 UT) capsule Vitamin D3   desloratadine  (CLARINEX ) 5 MG tablet Take 1 tablet (5 mg total) by mouth daily.   desloratadine  (CLARINEX ) 5 MG tablet Take 1 tablet (5 mg) by mouth daily.   fluticasone  (FLONASE ) 50 MCG/ACT nasal spray Place 1 spray into both nostrils in the morning and at bedtime.   ipratropium (ATROVENT ) 0.03 %  nasal spray Place 2 sprays into both nostrils 3 (three) times daily as needed for rhinitis.   montelukast  (SINGULAIR ) 10 MG tablet Take 1 tablet (10 mg total) by mouth at bedtime.   Olopatadine HCl 0.6 % SOLN 2 sprays in each nostril   Benralizumab  (FASENRA  PEN) 30 MG/ML SOAJ Inject 1 mL (30 mg total) into the skin every 28 (twenty-eight) days. For 2 additional doses then every 8 weeks (Patient not taking: Reported on 04/24/2024)   No current facility-administered medications on file prior to visit.   Surgical History:  History reviewed. No pertinent surgical history. Allergies:  Allergies  Allergen Reactions   Milk Protein Shortness Of Breath    ASTHMA FLARES   Family History:  History reviewed. No pertinent family history.     Objective:    BP 120/72   Pulse 64   Ht 5' 2 (1.575 m)   Wt 133 lb 3.2 oz (60.4 kg)   LMP 03/18/2024   BMI 24.36 kg/m   Wt Readings from Last 3 Encounters:  04/24/24 133 lb 3.2 oz (60.4 kg)  04/16/24 131 lb 12.8 oz (59.8 kg)  03/28/24 120 lb (54.4 kg)    Physical Exam Vitals and nursing note reviewed.  Constitutional:      General: She is not in acute distress.    Appearance: Normal appearance. She is normal weight.  HENT:     Head: Normocephalic and atraumatic.     Right Ear: Hearing, tympanic membrane, ear canal and external ear normal.     Left Ear: Hearing, tympanic membrane, ear canal and external ear normal.     Nose: Nose normal.     Right Sinus: No maxillary sinus tenderness or frontal sinus tenderness.     Left Sinus: No maxillary sinus tenderness or frontal sinus tenderness.     Mouth/Throat:     Lips: Pink.     Mouth: Mucous membranes are moist.     Pharynx: Oropharynx is clear.  Eyes:     General: Lids are normal. Vision grossly intact.     Extraocular Movements: Extraocular movements intact.     Conjunctiva/sclera: Conjunctivae normal.     Pupils: Pupils are equal, round, and reactive to light.     Funduscopic exam:     Right eye: Red reflex present.        Left eye: Red reflex present.    Visual Fields: Right eye visual fields normal and  left eye visual fields normal.  Neck:     Thyroid : No thyromegaly.     Vascular: No carotid bruit.  Cardiovascular:     Rate and Rhythm: Normal rate and regular rhythm.     Chest Wall: PMI is not displaced.     Pulses: Normal pulses.          Dorsalis pedis pulses are 2+ on the right side and 2+ on the left side.       Posterior tibial pulses are 2+ on the right side and 2+ on the left side.     Heart sounds: Normal heart sounds. No murmur heard. Pulmonary:     Effort: Pulmonary effort is normal. No respiratory distress.     Breath sounds: Normal breath sounds.  Abdominal:     General: Abdomen is flat. Bowel sounds are normal. There is no distension.     Palpations: Abdomen is soft. There is no hepatomegaly, splenomegaly or mass.     Tenderness: There is no abdominal tenderness. There is no right CVA tenderness, left CVA tenderness, guarding or rebound.  Musculoskeletal:        General: Normal range of motion.     Cervical back: Full passive range of motion without pain, normal range of motion and neck supple. No tenderness.     Right lower leg: No edema.     Left lower leg: No edema.  Feet:     Left foot:     Toenail Condition: Left toenails are normal.  Lymphadenopathy:     Cervical: No cervical adenopathy.     Upper Body:     Right upper body: No supraclavicular adenopathy.     Left upper body: No supraclavicular adenopathy.  Skin:    General: Skin is warm and dry.     Capillary Refill: Capillary refill takes less than 2 seconds.     Nails: There is no clubbing.  Neurological:     General: No focal deficit present.     Mental Status: She is alert and oriented to person, place, and time.     GCS: GCS eye subscore is 4. GCS verbal subscore is 5. GCS motor subscore is 6.     Sensory: Sensation is intact. No sensory deficit.     Motor: Motor function is  intact. No weakness.     Coordination: Coordination is intact. Coordination normal.     Gait: Gait is intact. Gait normal.     Deep Tendon Reflexes: Reflexes are normal and symmetric.  Psychiatric:        Attention and Perception: Attention normal.        Mood and Affect: Mood normal.        Speech: Speech normal.        Behavior: Behavior normal. Behavior is cooperative.        Thought Content: Thought content normal.        Cognition and Memory: Cognition and memory normal.        Judgment: Judgment normal.      Results for orders placed or performed in visit on 04/24/24  CBC with Differential/Platelet   Collection Time: 04/24/24  4:00 PM  Result Value Ref Range   WBC 6.4 3.4 - 10.8 x10E3/uL   RBC 4.50 3.77 - 5.28 x10E6/uL   Hemoglobin 14.6 11.1 - 15.9 g/dL   Hematocrit 58.1 65.9 - 46.6 %   MCV 93 79 - 97 fL   MCH 32.4 26.6 - 33.0 pg   MCHC 34.9 31.5 -  35.7 g/dL   RDW 88.1 88.2 - 84.5 %   Platelets 374 150 - 450 x10E3/uL   Neutrophils 64 Not Estab. %   Lymphs 23 Not Estab. %   Monocytes 8 Not Estab. %   Eos 4 Not Estab. %   Basos 1 Not Estab. %   Neutrophils Absolute 4.1 1.4 - 7.0 x10E3/uL   Lymphocytes Absolute 1.4 0.7 - 3.1 x10E3/uL   Monocytes Absolute 0.5 0.1 - 0.9 x10E3/uL   EOS (ABSOLUTE) 0.2 0.0 - 0.4 x10E3/uL   Basophils Absolute 0.1 0.0 - 0.2 x10E3/uL   Immature Granulocytes 0 Not Estab. %   Immature Grans (Abs) 0.0 0.0 - 0.1 x10E3/uL  CMP14+EGFR   Collection Time: 04/24/24  4:00 PM  Result Value Ref Range   Glucose 87 70 - 99 mg/dL   BUN 9 6 - 20 mg/dL   Creatinine, Ser 9.25 0.57 - 1.00 mg/dL   eGFR 892 >40 fO/fpw/8.26   BUN/Creatinine Ratio 12 9 - 23   Sodium 140 134 - 144 mmol/L   Potassium 4.3 3.5 - 5.2 mmol/L   Chloride 102 96 - 106 mmol/L   CO2 25 20 - 29 mmol/L   Calcium 9.2 8.7 - 10.2 mg/dL   Total Protein 6.9 6.0 - 8.5 g/dL   Albumin 4.7 3.9 - 4.9 g/dL   Globulin, Total 2.2 1.5 - 4.5 g/dL   Bilirubin Total <9.7 0.0 - 1.2 mg/dL   Alkaline  Phosphatase 75 41 - 116 IU/L   AST 20 0 - 40 IU/L   ALT 14 0 - 32 IU/L  TSH + free T4   Collection Time: 04/24/24  4:00 PM  Result Value Ref Range   TSH 0.874 0.450 - 4.500 uIU/mL   Free T4 1.23 0.82 - 1.77 ng/dL  Hemoglobin J8r   Collection Time: 04/24/24  4:01 PM  Result Value Ref Range   Hgb A1c MFr Bld 4.8 4.8 - 5.6 %   Est. average glucose Bld gHb Est-mCnc 91 mg/dL  Vitamin B12   Collection Time: 04/24/24  4:01 PM  Result Value Ref Range   Vitamin B-12 341 232 - 1,245 pg/mL  VITAMIN D  25 Hydroxy (Vit-D Deficiency, Fractures)   Collection Time: 04/24/24  4:01 PM  Result Value Ref Range   Vit D, 25-Hydroxy 37.6 30.0 - 100.0 ng/mL       Assessment & Plan:   Problem List Items Addressed This Visit     Mild intermittent asthma without complication   Adverse food reaction   Encounter for annual physical exam - Primary   Other Visit Diagnoses       Anaphylaxis, subsequent encounter       Relevant Medications   EPINEPHrine 0.3 mg/0.3 mL IJ SOAJ injection     Encounter for initial prescription of injectable contraceptive       Relevant Medications   medroxyPROGESTERone (DEPO-PROVERA) injection 150 mg   Other Relevant Orders   TSH + free T4 (Completed)     Screening for endocrine, nutritional, metabolic and immunity disorder       Relevant Orders   CBC with Differential/Platelet (Completed)   CMP14+EGFR (Completed)   TSH + free T4 (Completed)     Encounter for health-related screening       Relevant Orders   Hemoglobin A1c (Completed)   Vitamin B12 (Completed)   VITAMIN D  25 Hydroxy (Vit-D Deficiency, Fractures) (Completed)       Assessment and Plan Contraceptive management (Depo-Provera injection) She opted for Depo-Provera injection for  contraception due to its effectiveness and convenience, avoiding daily pill adherence. Discussed potential side effects, including weight gain, and the option to switch if side effects occur. Informed that fertility returns  quickly after discontinuation, with immediate return in some cases and up to three months in others. Insurance covers the injection 100%. - Administered Depo-Provera injection today. - Set reminder for next injection in three months. - Use backup contraception or abstain from intercourse for one week after injection.  Asthma with milk allergy  Asthma is well-controlled with medication and avoidance of milk products. She can consume baked dairy in moderation. Discussed the importance of carrying an EpiPen due to milk allergy . - Refilled EpiPen prescription. - Continue current asthma management plan.  Thyroid  function evaluation She reports episodes of feeling hot, which is atypical for her. Differential diagnosis includes thyroid  dysfunction. Discussed the possibility of low blood sugar contributing to symptoms. - Ordered thyroid  function tests. - Ordered blood tests to check white blood counts, red blood counts, kidney function, liver function, electrolytes, and A1c.  General Health Maintenance Flu shot already administered. Discussed the importance of maintaining a balanced diet, especially with dairy-free options due to milk allergy . - Continue using dairy-free protein drinks like O W M O. - Ensure regular meals to prevent low blood sugar episodes.     Follow up plan: Return for 3 months depo provera.  NEXT PREVENTATIVE PHYSICAL DUE IN 1 YEAR.  PATIENT COUNSELING PROVIDED FOR ALL ADULT PATIENTS: A well balanced diet low in saturated fats, cholesterol, and moderation in carbohydrates.  This can be as simple as monitoring portion sizes and cutting back on sugary beverages such as soda and juice to start with.    Daily water consumption of at least 64 ounces.  Physical activity at least 180 minutes per week.  If just starting out, start 10 minutes a day and work your way up.   This can be as simple as taking the stairs instead of the elevator and walking 2-3 laps around the office   purposefully every day.   STD protection, partner selection, and regular testing if high risk.  Limited consumption of alcoholic beverages if alcohol is consumed. For men, I recommend no more than 14 alcoholic beverages per week, spread out throughout the week (max 2 per day). Avoid binge drinking or consuming large quantities of alcohol in one setting.  Please let me know if you feel you may need help with reduction or quitting alcohol consumption.   Avoidance of nicotine, if used. Please let me know if you feel you may need help with reduction or quitting nicotine use.   Daily mental health attention. This can be in the form of 5 minute daily meditation, prayer, journaling, yoga, reflection, etc.  Purposeful attention to your emotions and mental state can significantly improve your overall wellbeing  and  Health.  Please know that I am here to help you with all of your health care goals and am happy to work with you to find a solution that works best for you.  The greatest advice I have received with any changes in life are to take it one step at a time, that even means if all you can focus on is the next 60 seconds, then do that and celebrate your victories.  With any changes in life, you will have set backs, and that is OK. The important thing to remember is, if you have a set back, it is not a failure, it is an opportunity to  try again! Screening Testing Mammogram Every 1 -2 years based on history and risk factors Starting at age 43 Pap Smear Ages 21-39 every 3 years Ages 37-65 every 5 years with HPV testing More frequent testing may be required based on results and history Colon Cancer Screening Every 1-10 years based on test performed, risk factors, and history Starting at age 50 Bone Density Screening Every 2-10 years based on history Starting at age 66 for women Recommendations for men differ based on medication usage, history, and risk factors AAA Screening One time  ultrasound Men 15-35 years old who have every smoked Lung Cancer Screening Low Dose Lung CT every 12 months Age 59-80 years with a 30 pack-year smoking history who still smoke or who have quit within the last 15 years   Screening Labs Routine  Labs: Complete Blood Count (CBC), Complete Metabolic Panel (CMP), Cholesterol (Lipid Panel) Every 6-12 months based on history and medications May be recommended more frequently based on current conditions or previous results Hemoglobin A1c Lab Every 3-12 months based on history and previous results Starting at age 70 or earlier with diagnosis of diabetes, high cholesterol, BMI >26, and/or risk factors Frequent monitoring for patients with diabetes to ensure blood sugar control Thyroid  Panel (TSH) Every 6 months based on history, symptoms, and risk factors May be repeated more often if on medication HIV One time testing for all patients 46 and older May be repeated more frequently for patients with increased risk factors or exposure Hepatitis C One time testing for all patients 57 and older May be repeated more frequently for patients with increased risk factors or exposure Gonorrhea, Chlamydia Every 12 months for all sexually active persons 13-24 years Additional monitoring may be recommended for those who are considered high risk or who have symptoms Every 12 months for any woman on birth control, regardless of sexual activity PSA Men 31-71 years old with risk factors Additional screening may be recommended from age 25-69 based on risk factors, symptoms, and history  Vaccine Recommendations Tetanus Booster All adults every 10 years Flu Vaccine All patients 6 months and older every year COVID Vaccine All patients 12 years and older Initial dosing with booster May recommend additional booster based on age and health history HPV Vaccine 2 doses all patients age 49-26 Dosing may be considered for patients over 26 Shingles Vaccine  (Shingrix) 2 doses all adults 55 years and older Pneumonia (Pneumovax 36) All adults 65 years and older May recommend earlier dosing based on health history One year apart from Prevnar 63 Pneumonia (Prevnar 43) All adults 65 years and older Dosed 1 year after Pneumovax 23 Pneumonia (Prevnar 20) One time alternative to the two dosing of 13 and 23 For all adults with initial dose of 23, 20 is recommended 1 year later For all adults with initial dose of 13, 23 is still recommended as second option 1 year later

## 2024-04-24 NOTE — Patient Instructions (Addendum)
 Depo-Provera (birth control shot)   Overview Birth control shot Enlarge image  Depo-Provera is a well-known brand name for medroxyprogesterone acetate. It's a birth control shot that has the hormone progestin. A birth control shot is also called a contraceptive injection. People who take Depo-Provera get a shot every three months. Depo-Provera most often stops the ovaries from releasing an egg. So there's no ovulation. Depo-Provera also thickens mucus in the lower end of the uterus, called the cervix, to keep sperm from reaching the egg. And it thins the lining of the uterus. Medroxyprogesterone acetate also comes in a lower dosage. This version is called Depo-SubQ Provera 104. While Depo-Provera goes deep into the muscle, Depo-SubQ Provera 104 goes just beneath the skin. Both Depo-Provera and Depo-SubQ Provera 104 are alike in how they work and in their risks. To use Depo-Provera or Depo-SubQ Provera 104, you need to see a healthcare professional. Products & Services A Book: [facility] Guide to a Healthy Pregnancy Why it's done Depo-Provera is used to prevent pregnancy and manage medical conditions linked to the menstrual cycle. A healthcare professional might suggest Depo-Provera if: You don't want to take a birth control pill every day. You can't or don't want to use estrogen. You have health problems such as anemia, seizures, sickle cell disease, endometriosis or uterine fibroids. Among the pluses, Depo-Provera: Doesn't need daily action. Gets rid of the need to stop sex for birth control. Lessens menstrual cramps and pain. Lessens menstrual blood flow and sometimes stops periods. Lowers the risk of uterine and endometrial cancer. But Depo-Provera isn't for everyone. You might not want to use Depo-Provera if you have: Vaginal bleeding with no known cause. Breast cancer. Liver disease. Reactions to any part of Depo-Provera. Risk factors for the bone-thinning condition called  osteoporosis. A history of heart attack or stroke. Also, tell your healthcare professional if you have diabetes, uncontrolled high blood pressure, a history of heart disease or stroke, or a history of vaginal bleeding with no known cause. Risks In a year of typical use, about 6 out of 100 people will get pregnant in the first year of using Depo-Provera. But the risk of pregnancy is much lower for people who get the shot every three months. Studies show that both types of Depo-Provera work well. People who give themselves the shots may use the birth control longer than those who get the shots from a healthcare professional. Among the things to think about with Depo-Provera are: You might have a delay in being able to get pregnant, called fertility, when you want to. After stopping Depo-Provera, it might take 10 months or more before you begin ovulating again. If you want to become pregnant in the next year or so, Depo-Provera might not be the right birth control method for you. Depo-Provera doesn't protect against sexually transmitted infections. In fact, some studies suggest that hormonal birth control such as Depo-Provera might raise the risk of chlamydia and HIV. It isn't known whether this link is due to the hormone or the behavior of people who use Depo-Provera. Condoms lower the risk of a sexually transmitted infection. If you're worried about HIV, talk with your healthcare professional. It might affect the bones. Research has suggested that Depo-Provera and Depo-SubQ Provera 104 might cause bones to weaken. But once you stop the shots, some or all the lost bone may come back. And the bone loss is not likely to increase the risk of broken bones. But the [place] Food and Drug Administration has strong  warnings on Depo-Provera and Depo-SubQ Provera 104 packages. The warning states that these medicines shouldn't be used for longer than two years. The warning also says that these products might raise the  risk of the bone-thinning condition osteoporosis. It's not known if using these products raises the risk of broken bones later in life. If you have other risk factors for osteoporosis, talk with your healthcare professional about whether this form of birth control is a good choice for you. Ask about other birth control choices. Risk factors for osteoporosis include a family history of the condition and some eating disorders. Other side effects of Depo-Provera most often ease or stop within the first few months. They might include: Belly pain. Bloating. Less interest in sex. Depression. Dizziness. Headaches. Irregular periods and breakthrough bleeding. Nervousness. Weakness and tiredness. Weight gain. Contact your healthcare professional as soon as possible if you have: Mood change. Heavy bleeding or worries about your bleeding. Trouble breathing. Pus, prolonged pain, redness, itching or bleeding at the site of the shot. Bad lower belly pain. A serious allergic reaction. Other symptoms that worry you. How you prepare You need a prescription for Depo-Provera. Your healthcare professional reviews your medical history and may check your blood pressure before prescribing the medicine. Talk to your healthcare professional about all your medicines. Include medicines you take without a prescription and herbal products. If you want to give yourself Depo-Provera shots at home, ask your healthcare professional about that choice. What you can expect To use Depo-Provera: Talk with your healthcare professional about a starting date. You need to be sure that you're not pregnant when you get Depo-Provera. So your first shot should be within seven days of the start of your period. If you don't get it then, use backup birth control, such as condoms, for two weeks. If you've just given birth, you'll get your first shot within five days of giving birth, even if you're breastfeeding. If you start Depo-Provera  at other times, you might need to take a pregnancy test first. Prepare for your shot. A member of your healthcare team first cleans the area where you get the shot with an alcohol pad. After the shot, don't rub the site. Depending on your start date, your healthcare team might suggest that you use a backup method of birth control for seven days after your first shot. You won't need backup birth control after your other shots if you get them on schedule. Schedule your next shot. You need to get Depo-Provera shots every three months or every 13 weeks. If you wait longer than 15 weeks between shots, you might need to take a pregnancy test before your next shot and use a backup method of birth control for seven days after the shot.

## 2024-04-25 ENCOUNTER — Ambulatory Visit: Payer: Self-pay | Admitting: Nurse Practitioner

## 2024-04-25 LAB — CBC WITH DIFFERENTIAL/PLATELET
Basophils Absolute: 0.1 x10E3/uL (ref 0.0–0.2)
Basos: 1 %
EOS (ABSOLUTE): 0.2 x10E3/uL (ref 0.0–0.4)
Eos: 4 %
Hematocrit: 41.8 % (ref 34.0–46.6)
Hemoglobin: 14.6 g/dL (ref 11.1–15.9)
Immature Grans (Abs): 0 x10E3/uL (ref 0.0–0.1)
Immature Granulocytes: 0 %
Lymphocytes Absolute: 1.4 x10E3/uL (ref 0.7–3.1)
Lymphs: 23 %
MCH: 32.4 pg (ref 26.6–33.0)
MCHC: 34.9 g/dL (ref 31.5–35.7)
MCV: 93 fL (ref 79–97)
Monocytes Absolute: 0.5 x10E3/uL (ref 0.1–0.9)
Monocytes: 8 %
Neutrophils Absolute: 4.1 x10E3/uL (ref 1.4–7.0)
Neutrophils: 64 %
Platelets: 374 x10E3/uL (ref 150–450)
RBC: 4.5 x10E6/uL (ref 3.77–5.28)
RDW: 11.8 % (ref 11.7–15.4)
WBC: 6.4 x10E3/uL (ref 3.4–10.8)

## 2024-04-25 LAB — CMP14+EGFR
ALT: 14 IU/L (ref 0–32)
AST: 20 IU/L (ref 0–40)
Albumin: 4.7 g/dL (ref 3.9–4.9)
Alkaline Phosphatase: 75 IU/L (ref 41–116)
BUN/Creatinine Ratio: 12 (ref 9–23)
BUN: 9 mg/dL (ref 6–20)
Bilirubin Total: <0.2 mg/dL (ref 0.0–1.2)
CO2: 25 mmol/L (ref 20–29)
Calcium: 9.2 mg/dL (ref 8.7–10.2)
Chloride: 102 mmol/L (ref 96–106)
Creatinine, Ser: 0.74 mg/dL (ref 0.57–1.00)
Globulin, Total: 2.2 g/dL (ref 1.5–4.5)
Glucose: 87 mg/dL (ref 70–99)
Potassium: 4.3 mmol/L (ref 3.5–5.2)
Sodium: 140 mmol/L (ref 134–144)
Total Protein: 6.9 g/dL (ref 6.0–8.5)
eGFR: 107 mL/min/1.73 (ref >59)

## 2024-04-25 LAB — HEMOGLOBIN A1C
Est. average glucose Bld gHb Est-mCnc: 91 mg/dL
Hgb A1c MFr Bld: 4.8 % (ref 4.8–5.6)

## 2024-04-25 LAB — TSH+FREE T4
Free T4: 1.23 ng/dL (ref 0.82–1.77)
TSH: 0.874 u[IU]/mL (ref 0.450–4.500)

## 2024-04-25 LAB — VITAMIN D 25 HYDROXY (VIT D DEFICIENCY, FRACTURES): Vit D, 25-Hydroxy: 37.6 ng/mL (ref 30.0–100.0)

## 2024-04-25 LAB — VITAMIN B12: Vitamin B-12: 341 pg/mL (ref 232–1245)

## 2024-05-14 ENCOUNTER — Other Ambulatory Visit (HOSPITAL_COMMUNITY): Payer: Self-pay

## 2024-05-14 ENCOUNTER — Other Ambulatory Visit: Payer: Self-pay

## 2024-06-06 DIAGNOSIS — H5213 Myopia, bilateral: Secondary | ICD-10-CM | POA: Diagnosis not present

## 2024-06-07 ENCOUNTER — Other Ambulatory Visit: Payer: Self-pay | Admitting: Family Medicine

## 2024-06-08 ENCOUNTER — Other Ambulatory Visit (HOSPITAL_COMMUNITY): Payer: Self-pay

## 2024-06-08 MED ORDER — MONTELUKAST SODIUM 10 MG PO TABS
10.0000 mg | ORAL_TABLET | Freq: Every day | ORAL | 1 refills | Status: AC
  Filled 2024-06-08: qty 90, 90d supply, fill #0

## 2024-07-18 ENCOUNTER — Other Ambulatory Visit

## 2024-07-18 NOTE — Progress Notes (Unsigned)
 Patient is in office today for a nurse visit for Birth Control Injection. Patient Injection was given in the  Left upper quad. gluteus. Patient tolerated injection well.

## 2024-08-20 ENCOUNTER — Encounter: Admitting: Nurse Practitioner

## 2024-10-15 ENCOUNTER — Ambulatory Visit: Admitting: Internal Medicine

## 2025-04-25 ENCOUNTER — Encounter: Admitting: Nurse Practitioner
